# Patient Record
Sex: Female | Born: 1960 | Race: White | Hispanic: No | Marital: Married | Smoking: Former smoker
Health system: Southern US, Community
[De-identification: ages and names within clinical notes are randomized; demographics above are authoritative.]

## PROBLEM LIST (undated history)

## (undated) DIAGNOSIS — R7309 Other abnormal glucose: Secondary | ICD-10-CM

## (undated) DIAGNOSIS — Z8619 Personal history of other infectious and parasitic diseases: Secondary | ICD-10-CM

## (undated) DIAGNOSIS — I629 Nontraumatic intracranial hemorrhage, unspecified: Secondary | ICD-10-CM

## (undated) DIAGNOSIS — G4484 Primary exertional headache: Secondary | ICD-10-CM

## (undated) DIAGNOSIS — R5383 Other fatigue: Secondary | ICD-10-CM

## (undated) DIAGNOSIS — Z9889 Other specified postprocedural states: Secondary | ICD-10-CM

## (undated) DIAGNOSIS — M353 Polymyalgia rheumatica: Secondary | ICD-10-CM

## (undated) DIAGNOSIS — H269 Unspecified cataract: Secondary | ICD-10-CM

## (undated) DIAGNOSIS — K8020 Calculus of gallbladder without cholecystitis without obstruction: Secondary | ICD-10-CM

## (undated) DIAGNOSIS — K219 Gastro-esophageal reflux disease without esophagitis: Secondary | ICD-10-CM

## (undated) DIAGNOSIS — E785 Hyperlipidemia, unspecified: Secondary | ICD-10-CM

## (undated) DIAGNOSIS — J302 Other seasonal allergic rhinitis: Secondary | ICD-10-CM

## (undated) HISTORY — DX: Primary exertional headache: G44.84

## (undated) HISTORY — DX: Unspecified cataract: H26.9

## (undated) HISTORY — DX: Calculus of gallbladder without cholecystitis without obstruction: K80.20

## (undated) HISTORY — DX: Polymyalgia rheumatica: M35.3

## (undated) HISTORY — DX: Other fatigue: R53.83

## (undated) HISTORY — DX: Other seasonal allergic rhinitis: J30.2

## (undated) HISTORY — DX: Hyperlipidemia, unspecified: E78.5

## (undated) HISTORY — DX: Gastro-esophageal reflux disease without esophagitis: K21.9

## (undated) HISTORY — DX: Nontraumatic intracranial hemorrhage, unspecified: I62.9

## (undated) HISTORY — DX: Other specified postprocedural states: Z98.890

## (undated) HISTORY — DX: Personal history of other infectious and parasitic diseases: Z86.19

## (undated) HISTORY — DX: Other abnormal glucose: R73.09

---

## 1993-01-02 HISTORY — PX: ANTERIOR CRUCIATE LIGAMENT REPAIR: SHX115

## 1998-11-09 ENCOUNTER — Encounter: Admission: RE | Admit: 1998-11-09 | Discharge: 1998-11-09 | Payer: Self-pay | Admitting: Obstetrics and Gynecology

## 1998-11-09 ENCOUNTER — Encounter: Payer: Self-pay | Admitting: Obstetrics and Gynecology

## 2002-09-01 ENCOUNTER — Encounter: Payer: Self-pay | Admitting: Obstetrics and Gynecology

## 2002-09-01 ENCOUNTER — Encounter: Admission: RE | Admit: 2002-09-01 | Discharge: 2002-09-01 | Payer: Self-pay | Admitting: Obstetrics and Gynecology

## 2003-09-03 ENCOUNTER — Encounter: Admission: RE | Admit: 2003-09-03 | Discharge: 2003-09-03 | Payer: Self-pay | Admitting: Obstetrics and Gynecology

## 2003-10-26 ENCOUNTER — Emergency Department (HOSPITAL_COMMUNITY): Admission: EM | Admit: 2003-10-26 | Discharge: 2003-10-26 | Payer: Self-pay | Admitting: Family Medicine

## 2006-02-21 ENCOUNTER — Encounter: Admission: RE | Admit: 2006-02-21 | Discharge: 2006-02-21 | Payer: Self-pay | Admitting: Obstetrics and Gynecology

## 2006-02-21 ENCOUNTER — Other Ambulatory Visit: Admission: RE | Admit: 2006-02-21 | Discharge: 2006-02-21 | Payer: Self-pay | Admitting: Obstetrics and Gynecology

## 2006-03-02 ENCOUNTER — Encounter: Admission: RE | Admit: 2006-03-02 | Discharge: 2006-03-02 | Payer: Self-pay | Admitting: Obstetrics and Gynecology

## 2007-05-29 ENCOUNTER — Other Ambulatory Visit: Admission: RE | Admit: 2007-05-29 | Discharge: 2007-05-29 | Payer: Self-pay | Admitting: Obstetrics and Gynecology

## 2007-12-06 ENCOUNTER — Encounter: Admission: RE | Admit: 2007-12-06 | Discharge: 2007-12-06 | Payer: Self-pay | Admitting: Gastroenterology

## 2010-09-23 ENCOUNTER — Encounter: Payer: Self-pay | Admitting: *Deleted

## 2010-09-26 ENCOUNTER — Encounter: Payer: Self-pay | Admitting: Cardiology

## 2010-09-26 ENCOUNTER — Ambulatory Visit (INDEPENDENT_AMBULATORY_CARE_PROVIDER_SITE_OTHER): Payer: BC Managed Care – PPO | Admitting: Cardiology

## 2010-09-26 VITALS — BP 122/82 | HR 80 | Ht 63.0 in | Wt 192.0 lb

## 2010-09-26 DIAGNOSIS — E782 Mixed hyperlipidemia: Secondary | ICD-10-CM

## 2010-09-26 NOTE — Patient Instructions (Signed)
Your physician encouraged you to lose weight for better health. 2 pounds per week for a total of 40 pounds  Your physician recommends that you schedule a follow-up appointment in:  As needed with Dr. Daleen Squibb

## 2010-09-26 NOTE — Progress Notes (Signed)
HPI Jeanne Porter is a 50 year old white female who is referred by Dr. Cherly Hensen for evaluation and management of mixed hyperlipidemia.  Her last cholesterol was 295, triglycerides 29, LDL 190, VLDL 42, HDL 63.  There's a long family history of hyperlipidemia. Her as been compounded by the fact she has a gained about 40 pounds in the last several years. She blames most this on menopause.    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 She feels she's disciplined enough to lose 40 pounds. His does not want to take medication. He  TSH was normal.  She denies any symptoms of angina or ischemia. Her only other cardiac risk factor is remote smoking which she stopped years ago.     Past Medical History  Diagnosis Date  . Fatigue   . Elevated glucose     Past Surgical History  Procedure Date  . Anterior cruciate ligament repair     right knee    Family History  Problem Relation Age of Onset  . Diabetes    . Heart disease    . Heart failure    . Heart attack      History   Social History  . Marital Status: Single    Spouse Name: N/A    Number of Children: N/A  . Years of Education: N/A   Occupational History  . Not on file.   Social History Main Topics  . Smoking status: Former Games developer  . Smokeless tobacco: Not on file   Comment: quit 1994  . Alcohol Use: Yes  . Drug Use: No  . Sexually Active: Not on file   Other Topics Concern  . Not on file   Social History Narrative  . No narrative on file    No Known Allergies  Current Outpatient Prescriptions  Medication Sig Dispense Refill  . fluticasone (FLONASE) 50 MCG/ACT nasal spray Take 1 tablet by mouth as needed.        ROS Negative other than HPI.   PE General Appearance: well developed, well nourished in no acute distress, overweight HEENT: symmetrical face, PERRLA, good dentition  Neck: no JVD, thyromegaly, or adenopathy, trachea midline Chest: symmetric without deformity Cardiac: PMI  non-displaced, RRR, normal S1, S2, no gallop or murmur Lung: clear to ausculation and percussion Vascular: all pulses full without bruits  Abdominal: nondistended, nontender, good bowel sounds, no HSM, no bruits Extremities: no cyanosis, clubbing or edema, no sign of DVT, no varicosities  Skin: normal color, no rashes Neuro: alert and oriented x 3, non-focal Pysch: normal affect Filed Vitals:   09/26/10 1504  BP: 122/82  Pulse: 80  Height: 5\' 3"  (1.6 m)  Weight: 192 lb (87.091 kg)    EKG  Labs and Studies Reviewed.   No results found for this basename: WBC, HGB, HCT, MCV, PLT      Chemistry   No results found for this basename: NA, K, CL, CO2, BUN, CREATININE, GLU   No results found for this basename: CALCIUM, ALKPHOS, AST, ALT, BILITOT       No results found for this basename: CHOL   No results found for this basename: HDL   No results found for this basename: LDLCALC   No results found for this basename: TRIG   No results found for this basename: CHOLHDL   No results found for this basename: HGBA1C   No results found for this basename: ALT, AST, GGT, ALKPHOS, BILITOT   No results found for this basename: TSH

## 2010-10-06 ENCOUNTER — Encounter: Payer: Self-pay | Admitting: Cardiology

## 2012-01-25 ENCOUNTER — Encounter (HOSPITAL_COMMUNITY): Payer: Self-pay | Admitting: *Deleted

## 2012-01-25 ENCOUNTER — Emergency Department (HOSPITAL_COMMUNITY)
Admission: EM | Admit: 2012-01-25 | Discharge: 2012-01-26 | Disposition: A | Discharge status: Home / Self Care | Payer: BC Managed Care – PPO | Attending: Emergency Medicine | Admitting: Emergency Medicine

## 2012-01-25 ENCOUNTER — Encounter (HOSPITAL_COMMUNITY): Payer: Self-pay | Admitting: Emergency Medicine

## 2012-01-25 ENCOUNTER — Emergency Department (HOSPITAL_COMMUNITY)
Admission: EM | Admit: 2012-01-25 | Discharge: 2012-01-25 | Disposition: A | Discharge status: Nursing Home (Includes ICF) | Payer: BC Managed Care – PPO | Source: Home / Self Care | Attending: Family Medicine | Admitting: Family Medicine

## 2012-01-25 DIAGNOSIS — G4484 Primary exertional headache: Secondary | ICD-10-CM

## 2012-01-25 DIAGNOSIS — M542 Cervicalgia: Secondary | ICD-10-CM | POA: Insufficient documentation

## 2012-01-25 DIAGNOSIS — R51 Headache: Secondary | ICD-10-CM | POA: Insufficient documentation

## 2012-01-25 DIAGNOSIS — R110 Nausea: Secondary | ICD-10-CM | POA: Insufficient documentation

## 2012-01-25 DIAGNOSIS — Z87891 Personal history of nicotine dependence: Secondary | ICD-10-CM | POA: Insufficient documentation

## 2012-01-25 DIAGNOSIS — R42 Dizziness and giddiness: Secondary | ICD-10-CM | POA: Insufficient documentation

## 2012-01-25 DIAGNOSIS — IMO0002 Reserved for concepts with insufficient information to code with codable children: Secondary | ICD-10-CM | POA: Insufficient documentation

## 2012-01-25 NOTE — ED Notes (Signed)
Pt c/o severe headache on Monday that came on suddenly while lifting weights. Pt states that since then she has been feeling nausea and dizzy in spells off and on. Pt states that there is still a heaviness in her head and neck.   Pt had a CT scan done on Tuesday and has results with her. Pt has taken tylenol for pain with only mild relief.

## 2012-01-25 NOTE — ED Provider Notes (Signed)
History     CSN: [id number]  Arrival date & time 01/25/12  1705   First MD Initiated Contact with Patient 01/25/12 2002      Chief Complaint  Patient presents with  . Headache    (Consider location/radiation/quality/duration/timing/severity/associated sxs/prior treatment) Patient is a 52 y.o. female presenting with headaches.  Headache  Associated symptoms include nausea.   Patient presents to the emergency department sent here from the urgent care for further workup. 4 days ago patient was lifting very heavy weights when she had immediate occipital headache with nausea and mild weakness. She went to [facility] and had a CT scan done which was normal. However she continued to have some tightness in her neck which is getting much better and dizziness, nausea. So she went to [facility] today and after talking with the physician it was decided that she needed to come to the emergency department for further evaluation. The patient says that she is only having mild with of nausea, no more headache, no more dizziness, and some mild neck pain. The patient looks well is alert and oriented. She is in no acute distress.  Past Medical History  Diagnosis Date  . Fatigue   . Elevated glucose     Past Surgical History  Procedure Date  . Anterior cruciate ligament repair     right knee    Family History  Problem Relation Age of Onset  . Diabetes    . Heart disease    . Heart failure    . Heart attack      History  Substance Use Topics  . Smoking status: Former Games developer  . Smokeless tobacco: Not on file     Comment: quit 1994  . Alcohol Use: Yes    OB History    Grav Para Term Preterm Abortions TAB SAB Ect Mult Living                  Review of Systems  HENT: Positive for neck pain.   Gastrointestinal: Positive for nausea.  Neurological: Positive for headaches.    Allergies  Review of patient's allergies indicates no known allergies.  Home Medications    Current Outpatient Rx  Name  Route  Sig  Dispense  Refill  . FLUTICASONE PROPIONATE 50 MCG/ACT NA SUSP   Oral   Take 1 tablet by mouth as needed.         Marland Kitchen PRESCRIPTION MEDICATION   Transdermal   Place 1 application onto the skin daily. Estrogen replacement cream.           BP 127/54  Pulse 71  Temp 98.5 F (36.9 C) (Oral)  Resp 18  SpO2 100%  Physical Exam  Constitutional: She is oriented to person, place, and time. She appears well-developed and well-nourished.  HENT:  Head: Normocephalic and atraumatic.  Eyes: Conjunctivae normal are normal. Pupils are equal, round, and reactive to light.  Neck: Trachea normal, normal range of motion and full passive range of motion without pain. Neck supple.  Cardiovascular: Normal rate, regular rhythm and normal pulses.   Pulmonary/Chest: Effort normal and breath sounds normal. Chest wall is not dull to percussion. She exhibits no crepitus, no edema, no deformity and no retraction.  Abdominal: Soft. Normal appearance.  Musculoskeletal: Normal range of motion.  Neurological: She is alert and oriented to person, place, and time. She has normal strength.  Skin: Skin is warm, dry and intact.  Psychiatric: Her speech is normal and behavior is  normal. Thought content normal. Cognition and memory are normal.    ED Course  Procedures (including critical care time)   Labs Reviewed  CSF CELL COUNT WITH DIFFERENTIAL  CSF CELL COUNT WITH DIFFERENTIAL  PROTEIN AND GLUCOSE, CSF  CSF CULTURE   No results found. Prelim:  No evidence of aneurysm.  No evidence of dissection; the vertebral arteries appear intact, with a dominant left vertebral artery.  Mild atherosclerotic calcification noted at the proximal left vertebral artery, without evidence of luminal narrowing.  The vertebrobasilar system is unremarkable in appearance.  The ICA, MCA and ACA appear intact bilaterally.  The PCA are intact bilaterally.  No evidence of  occlusion.  Mild interstitial prominence noted at the lung apices.  Findings were discussed with Dr. Sunnie Nielsen at 02:45 a.m. on 01/26/2012.  No diagnosis found. Dx: exertional headache   MDM  Patient sent to the emergency department by urgent care. I discussed case with Dr. Manus Gunning for advisement on workup for this patient. He recommends that patient needs a lumbar puncture. The resident's well to this procedure.   Dr. Manus Gunning did LP and RBCs noted. CT  angio head/neck ordered to r/o aneurysm.   These were normal. I discussed case with Dr. Dierdre Highman who asked me to consult Dr. Venetia Maxon- Neurosurgery. Dr. Venetia Maxon says that since the patient is now asymptomatic aside from the nausea she is okay to go home. If symptoms persist she is to follow-up with Neurology as an outpatient.  Pt has been advised of the symptoms that warrant their return to the ED. Patient has voiced understanding and has agreed to follow-up with the PCP or specialist.     Dorthula Matas, PA 01/26/12 0356  Dorthula Matas, PA 01/26/12 [phone]

## 2012-01-25 NOTE — ED Notes (Signed)
The pt has had a headache and neck pain since Monday when she was lifting weights.  She also had nausea and weakness at the same time.  She was seen at an urgent care then and a day later she had a c-t scan which she has the results with her.  She still has a headache and intermittent weakness and nausea .

## 2012-01-25 NOTE — ED Provider Notes (Signed)
Lumbar Puncture Procedure Note  Pre-operative Diagnosis: Headache, r/o SAH  Post-operative Diagnosis: Headache  Indications: Diagnostic  Procedure Details   Consent: Informed consent was obtained. Risks of the procedure were discussed including: infection, bleeding, pain and headache.  The patient was positioned under sterile conditions. Betadine solution and sterile drapes were utilized. A spinal needle was inserted at the L4 - L5 interspace.  Spinal fluid was obtained and sent to the laboratory.  Findings 4mL of clear spinal fluid was obtained.   Complications:  None; patient tolerated the procedure well.        Condition: stable  Plan Bed rest for 1 hours. F/u LP results     Stevie Kern, MD 01/25/12 2322

## 2012-01-25 NOTE — ED Provider Notes (Signed)
History     CSN: [id number]  Arrival date & time 01/25/12  1539   First MD Initiated Contact with Patient 01/25/12 1557      Chief Complaint  Patient presents with  . Headache    severe headache on monday. now feeling nausea and dizziness in waves over the past several days.     (Consider location/radiation/quality/duration/timing/severity/associated sxs/prior treatment) Patient is a 52 y.o. female presenting with headaches. The history is provided by the patient.  Headache The primary symptoms include headaches, dizziness and nausea. The symptoms began 3 to 5 days ago (onset within minutes of completeing heavy bicep workout routinf at gym which got worse and has persistedto involve entire head since). The symptoms are waxing and waning. The symptoms occurred on exertion and while straining.  Dizziness also occurs with nausea.  Associated symptoms comments: Dizziness , nausea, increasing pain.. Workup history includes CT scan.    Past Medical History  Diagnosis Date  . Fatigue   . Elevated glucose     Past Surgical History  Procedure Date  . Anterior cruciate ligament repair     right knee    Family History  Problem Relation Age of Onset  . Diabetes    . Heart disease    . Heart failure    . Heart attack      History  Substance Use Topics  . Smoking status: Former Games developer  . Smokeless tobacco: Not on file     Comment: quit 1994  . Alcohol Use: Yes    OB History    Grav Para Term Preterm Abortions TAB SAB Ect Mult Living                  Review of Systems  Constitutional: Negative.   Gastrointestinal: Positive for nausea.  Neurological: Positive for dizziness and headaches.    Allergies  Review of patient's allergies indicates no known allergies.  Home Medications   Current Outpatient Rx  Name  Route  Sig  Dispense  Refill  . FLUTICASONE PROPIONATE 50 MCG/ACT NA SUSP   Oral   Take 1 tablet by mouth as needed.           BP 129/74  Pulse 63   Temp 98.2 F (36.8 C) (Oral)  Resp 16  SpO2 100%  Physical Exam  Nursing note and vitals reviewed. Constitutional: She is oriented to person, place, and time. She appears well-developed and well-nourished. No distress.  HENT:  Head: Normocephalic.  Right Ear: External ear normal.  Left Ear: External ear normal.  Mouth/Throat: Oropharynx is clear and moist.  Eyes: Conjunctivae normal and EOM are normal. Pupils are equal, round, and reactive to light.  Neck: Normal range of motion. Neck supple.  Cardiovascular: Regular rhythm.   Abdominal: Soft. Bowel sounds are normal. There is no tenderness.  Musculoskeletal: Normal range of motion.  Lymphadenopathy:    She has no cervical adenopathy.  Neurological: She is alert and oriented to person, place, and time. She displays normal reflexes. No cranial nerve deficit. She exhibits normal muscle tone. Coordination normal.  Skin: Skin is warm and dry.  Psychiatric: She has a normal mood and affect. Her behavior is normal. Judgment and thought content normal.    ED Course  Procedures (including critical care time)  Labs Reviewed - No data to display No results found.   1. Exertional headache       MDM          Linna Hoff, MD 01/25/12  1652 

## 2012-01-25 NOTE — ED Provider Notes (Addendum)
This chart was scribed for Glynn Octave, MD by Bennett Scrape, ED Scribe. This patient was seen in room CD10C/CD10C and the patient's care was started at 8:45 PM.  Jeanne Porter is a 52 y.o. female who presents to the Emergency Department complaining of sudden onset HA that radiated into the neck with associated nausea and dizziness while lifting weights. She denies having a HA currently but still c/o intermittent nausea and dizziness. Pt states that the HA started suddenly in the right frontal region and spread across to the back 3 to 4 minutes after she had just finished lifting weights. She denies having prior episodes of similar symptoms and denies having a h/o migraines. She reports that she was seen at an Urgent Care and had a negative CT scan performed 24 hours after the original onset. She reports that she is here for evaluation of persistent nausea and dizziness.  PE CONSTITUTIONAL: No distress, no meningismus. EYES: PERRL, EOM intact, Visual fields full to confrontation  CV: Intact distal pulses. 5/5 strength throughout, no ataxia on finger to nose.    8:53 PM-Discussed treatment plan which includes LP with pt at bedside and pt states that she would consider it. Advised pt that she would be leaving AMA if she decided to not get the LP.   Sudden onset headache 3 days ago during exercise which has resolved. Intermittent nausea and dizziness since.  Nonfocal neuro exam.  LP performed by resident Roxy Cedar without difficulty.  Fluid appears clear however RBCS noted 1010 in tube 1, 1060 in tube 4. No xanthochromia.  D/w Dr. Venetia Maxon of neurosurgery. Would expect xanthochromia at this point. He recommends CTA head and neck. Pending at time of sign out to Dr. Dierdre Highman and [facility] Neva Seat.  I personally performed the services described in this documentation, which was scribed in my presence. The recorded information has been reviewed and is accurate.    Glynn Octave, MD 01/26/12 6578  Glynn Octave, MD 01/26/12 1116

## 2012-01-26 ENCOUNTER — Emergency Department (HOSPITAL_COMMUNITY): Payer: BC Managed Care – PPO

## 2012-01-26 ENCOUNTER — Encounter (HOSPITAL_COMMUNITY): Payer: Self-pay | Admitting: Radiology

## 2012-01-26 LAB — POCT I-STAT, CHEM 8
BUN: 9 mg/dL (ref 6–23)
Calcium, Ion: 1.18 mmol/L (ref 1.12–1.23)
Creatinine, Ser: 0.9 mg/dL (ref 0.50–1.10)
Glucose, Bld: 101 mg/dL — ABNORMAL HIGH (ref 70–99)
Hemoglobin: 14.6 g/dL (ref 12.0–15.0)
TCO2: 25 mmol/L (ref 0–100)

## 2012-01-26 LAB — CSF CELL COUNT WITH DIFFERENTIAL
RBC Count, CSF: 1060 /mm3 — ABNORMAL HIGH
Tube #: 1
Tube #: 4

## 2012-01-26 LAB — GRAM STAIN

## 2012-01-26 LAB — PROTEIN AND GLUCOSE, CSF: Glucose, CSF: 60 mg/dL (ref 43–76)

## 2012-01-26 MED ORDER — IOHEXOL 350 MG/ML SOLN
50.0000 mL | Freq: Once | INTRAVENOUS | Status: AC | PRN
  Administered 2012-01-26: 50 mL via INTRAVENOUS

## 2012-01-26 NOTE — ED Provider Notes (Signed)
Medical screening examination/treatment/procedure(s) were conducted as a shared visit with non-physician practitioner(s) and myself.  I personally evaluated the patient during the encounter  See my additional notes  Glynn Octave, MD 01/26/12 1121

## 2012-01-26 NOTE — ED Notes (Signed)
returned from CT 

## 2012-01-26 NOTE — ED Provider Notes (Signed)
I saw and evaluated the patient, reviewed the resident's note and I agree with the findings and plan.  I was immediately available for procedure.  Glynn Octave, MD 01/26/12 [phone]

## 2012-01-26 NOTE — ED Notes (Addendum)
Patient transported to CT 

## 2012-01-29 LAB — CSF CULTURE W GRAM STAIN: Culture: NO GROWTH

## 2012-03-25 ENCOUNTER — Ambulatory Visit (INDEPENDENT_AMBULATORY_CARE_PROVIDER_SITE_OTHER): Payer: BC Managed Care – PPO | Admitting: Internal Medicine

## 2012-03-25 ENCOUNTER — Encounter: Payer: Self-pay | Admitting: Internal Medicine

## 2012-03-25 VITALS — BP 130/90 | HR 73 | Temp 98.7°F | Ht 62.75 in | Wt 182.0 lb

## 2012-03-25 DIAGNOSIS — J309 Allergic rhinitis, unspecified: Secondary | ICD-10-CM

## 2012-03-25 DIAGNOSIS — K219 Gastro-esophageal reflux disease without esophagitis: Secondary | ICD-10-CM

## 2012-03-25 DIAGNOSIS — Z79890 Hormone replacement therapy (postmenopausal): Secondary | ICD-10-CM

## 2012-03-25 DIAGNOSIS — J302 Other seasonal allergic rhinitis: Secondary | ICD-10-CM

## 2012-03-25 DIAGNOSIS — K8020 Calculus of gallbladder without cholecystitis without obstruction: Secondary | ICD-10-CM

## 2012-03-25 DIAGNOSIS — E782 Mixed hyperlipidemia: Secondary | ICD-10-CM

## 2012-03-25 NOTE — Progress Notes (Signed)
Chief Complaint  Patient presents with  . Establish Care    HPI: Patient comes in as new patient visit . Previous care was  [facility]    Needs pcp  Has been seen urgent care and ed but doing ok now.    Ed  X 2 visit for exertional HA   Evaluated with mri and lp some rbcs but no sx and fu op this ws oin January .  No fu unless   Had sx     Never had again. Thinks she reallky pushed to hard with lifting?    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   Gyne :Cousins  Also    HB:/? HH hx gets   Stomach problem  Has seen Dr Loreta Ave   And had Gallstones [place] : had attacks  But went to a   wholistic doctor   And " Flushed them out.  " no w feels better but ? If risk of not taking out gb such as cancer etc .    Post menopasual weight gain  Needs to work on this  And knows what she should do l.  Avoids meds   LIPIDS:  Worse after menopause. Saw dr wall in 2012 for lipid evaluation   Found via gyne dr Cherly Hensen.   ROS: See pertinent positives and negatives per HPI.  fam hx Mgm  60 s  Heart disease heart    High cholesterol .Marland Kitchen  Mom   Lipids   Down to 225 after ;lsi  Brother 300  At some point   ? On meds  NO premature CV disease Past Medical History  Diagnosis Date  . Fatigue   . Elevated glucose   . Hx of varicella   . GERD (gastroesophageal reflux disease)   . Seasonal allergies   . Hyperlipidemia   . Hx of right knee surgery     acl  . Gallstones     reported "cleansing " and passes many stones and no more sx currrently   . Exertional headache     had ed eval and had small  bleed ;felt to be able to follow  and recheck as needed per neuro     Family History  Problem Relation Age of Onset  . Diabetes    . Heart disease    . Heart failure    . Heart attack    . Hyperlipidemia Mother   . Alcohol abuse Father   . Hyperlipidemia Maternal Grandmother   . Heart disease Maternal Grandmother   . Diabetes Maternal Grandfather   . Heart disease Paternal  Grandmother     History   Social History  . Marital Status: Single    Spouse Name: N/A    Number of Children: N/A  . Years of Education: N/A   Social History Main Topics  . Smoking status: Former Games developer  . Smokeless tobacco: None     Comment: quit 1994  . Alcohol Use: Yes  . Drug Use: No  . Sexually Active: No   Other Topics Concern  . 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    Outpatient Encounter Prescriptions as of 03/25/2012  Medication Sig Dispense Refill  . fluticasone (FLONASE) 50 MCG/ACT nasal spray Take 1 tablet by mouth as needed.      Marland Kitchen PRESCRIPTION MEDICATION Place 1 application onto the skin daily. Estrogen replacement cream.       No facility-administered encounter medications on file as of 03/25/2012.    EXAM:  BP 130/90  Pulse 73  Temp(Src) 98.7 F (37.1 C) (Oral)  Ht 5' 2.75" (1.594 m)  Wt 182 lb (82.555 kg)  BMI 32.49 kg/m2  SpO2 97%  Body mass index is 32.49 kg/(m^2).  GENERAL: vitals reviewed and listed above, alert, oriented, appears well hydrated and in no acute distress  HEENT: atraumatic, conjunctiva  clear, no obvious abnormalities on inspection of external nose and ears OP : no lesion edema or exudate   NECK: no obvious masses on inspection palpation  No adenopathy or bruits  LUNGS: clear to auscultation bilaterally, no wheezes, rales or rhonchi, good air movement  CV: HRRR, no g or m  no clubbing cyanosis or  peripheral edema nl cap refill  Abdomen:  Sof,t normal bowel sounds without hepatosplenomegaly, no guarding rebound or masses no CVA tenderness MS: moves all extremities without noticeable focal  abnormality PSYCH: pleasant and cooperative, no  obvious depression or anxiety NEURO: oriented x 3 CN 3-12 appear intact. No focal muscle weakness or atrophy.. Gait WNL.  Grossly non focal. No tremor or abnormal movement. Oriented x 3 and no noted deficits in memory, attention, and speech.  ASSESSMENT AND PLAN:  Discussed the following assessment and plan:  Mixed hyperlipidemia - hx cvd but no premature  lsi still advised  will follow   Gallstones - says she passed them  she could still have gb dysfunction counseled  had gi evaluation  Seasonal allergies - otc meds except flonase  GERD (gastroesophageal reflux disease)  Postmenopausal HRT (hormone replacement therapy) - topical per GYNE Counseled regarding healthy nutrition, exercise, sleep, injury prevention, calcium vit d and healthy weight . -Patient advised to return or notify health care team  if symptoms worsen or persist or new concerns arise.  Patient Instructions  Intensify lifestyle interventions.  To help with lipids and  Decrease risk of vascular disease.   Weight loss  In a heathy manner   will help your health.      Plan labs in fall and  The OV to discuss.      Neta Mends. Sudais Banghart M.D.  Health Maintenance  Topic Date Due  . Influenza Vaccine  09/02/1961  . Colonoscopy  04/04/2011  . Tetanus/tdap  01/04/2013  . Mammogram  02/04/2014  . Pap Smear  02/05/2015   Assumed she had a colonoscopy  But not will call when wants referral  Or see Dr Loreta Ave }

## 2012-03-25 NOTE — Patient Instructions (Addendum)
Intensify lifestyle interventions.  To help with lipids and  Decrease risk of vascular disease.   Weight loss  In a heathy manner   will help your health.      Plan labs in fall and  The OV to discuss.

## 2012-03-31 ENCOUNTER — Encounter: Payer: Self-pay | Admitting: Internal Medicine

## 2012-03-31 DIAGNOSIS — J302 Other seasonal allergic rhinitis: Secondary | ICD-10-CM | POA: Insufficient documentation

## 2012-03-31 DIAGNOSIS — K8020 Calculus of gallbladder without cholecystitis without obstruction: Secondary | ICD-10-CM | POA: Insufficient documentation

## 2012-03-31 DIAGNOSIS — K219 Gastro-esophageal reflux disease without esophagitis: Secondary | ICD-10-CM | POA: Insufficient documentation

## 2012-03-31 DIAGNOSIS — Z79890 Hormone replacement therapy (postmenopausal): Secondary | ICD-10-CM | POA: Insufficient documentation

## 2012-10-04 ENCOUNTER — Other Ambulatory Visit: Payer: BC Managed Care – PPO

## 2012-10-11 ENCOUNTER — Ambulatory Visit: Payer: BC Managed Care – PPO | Admitting: Internal Medicine

## 2012-11-07 ENCOUNTER — Other Ambulatory Visit: Payer: Self-pay

## 2013-01-02 HISTORY — PX: EYE SURGERY: SHX253

## 2013-02-11 ENCOUNTER — Other Ambulatory Visit: Payer: Self-pay | Admitting: Gastroenterology

## 2013-02-11 DIAGNOSIS — R1013 Epigastric pain: Secondary | ICD-10-CM

## 2013-02-17 ENCOUNTER — Other Ambulatory Visit: Payer: BC Managed Care – PPO

## 2013-02-19 ENCOUNTER — Inpatient Hospital Stay: Admission: RE | Admit: 2013-02-19 | Payer: BC Managed Care – PPO | Source: Ambulatory Visit

## 2013-02-26 ENCOUNTER — Ambulatory Visit
Admission: RE | Admit: 2013-02-26 | Discharge: 2013-02-26 | Disposition: A | Discharge status: Home / Self Care | Payer: BC Managed Care – PPO | Source: Ambulatory Visit | Attending: Gastroenterology | Admitting: Gastroenterology

## 2013-02-26 DIAGNOSIS — R1013 Epigastric pain: Secondary | ICD-10-CM

## 2013-10-01 ENCOUNTER — Ambulatory Visit: Payer: BC Managed Care – PPO | Admitting: Internal Medicine

## 2013-10-02 ENCOUNTER — Ambulatory Visit: Payer: BC Managed Care – PPO | Admitting: Internal Medicine

## 2014-05-27 ENCOUNTER — Encounter (HOSPITAL_COMMUNITY): Payer: Self-pay | Admitting: Emergency Medicine

## 2014-05-27 ENCOUNTER — Emergency Department (HOSPITAL_COMMUNITY)
Admission: EM | Admit: 2014-05-27 | Discharge: 2014-05-27 | Disposition: A | Discharge status: Home / Self Care | Payer: BLUE CROSS/BLUE SHIELD | Source: Home / Self Care | Attending: Family Medicine | Admitting: Family Medicine

## 2014-05-27 DIAGNOSIS — Z23 Encounter for immunization: Secondary | ICD-10-CM | POA: Diagnosis not present

## 2014-05-27 DIAGNOSIS — T148 Other injury of unspecified body region: Secondary | ICD-10-CM | POA: Diagnosis not present

## 2014-05-27 DIAGNOSIS — IMO0002 Reserved for concepts with insufficient information to code with codable children: Secondary | ICD-10-CM

## 2014-05-27 MED ORDER — TETANUS-DIPHTH-ACELL PERTUSSIS 5-2.5-18.5 LF-MCG/0.5 IM SUSP
0.5000 mL | Freq: Once | INTRAMUSCULAR | Status: AC
  Administered 2014-05-27: 0.5 mL via INTRAMUSCULAR

## 2014-05-27 MED ORDER — TETANUS-DIPHTH-ACELL PERTUSSIS 5-2.5-18.5 LF-MCG/0.5 IM SUSP
INTRAMUSCULAR | Status: AC
  Filled 2014-05-27: qty 0.5

## 2014-05-27 MED ORDER — CEPHALEXIN 500 MG PO CAPS: 500.0000 mg | ORAL_CAPSULE | Freq: Two times a day (BID) | ORAL | Status: DC

## 2014-05-27 NOTE — ED Provider Notes (Signed)
Oliver HumMelanie G Deacon is a 54 y.o. female who presents to Urgent Care today for Laceration to the right fifth digit of the hand. Patient was using a razor blade to scrape of labile off of a glass and slipped. The razor blade cut her dorsal PIP on the fifth digit of the right hand. She denies any radiating pain weakness or numbness or difficulty with motion. She washed the wound out. Her last tetanus vaccination was over 10 years ago.   Past Medical History  Diagnosis Date  . Fatigue   . Elevated glucose   . Hx of varicella   . GERD (gastroesophageal reflux disease)   . Seasonal allergies   . Hyperlipidemia   . Hx of right knee surgery     acl  . Gallstones     reported "cleansing " and passes many stones and no more sx currrently   . Exertional headache     had ed eval and had small  bleed ;felt to be able to follow  and recheck as needed per neuro    Past Surgical History  Procedure Laterality Date  . Anterior cruciate ligament repair  1995    right knee   History  Substance Use Topics  . Smoking status: Former Games developermoker  . Smokeless tobacco: Not on file     Comment: quit 1994  . Alcohol Use: Yes   ROS as above Medications: No current facility-administered medications for this encounter.   Current Outpatient Prescriptions  Medication Sig Dispense Refill  . cephALEXin (KEFLEX) 500 MG capsule Take 1 capsule (500 mg total) by mouth 2 (two) times daily. 21 capsule 0  . fluticasone (FLONASE) 50 MCG/ACT nasal spray Take 1 tablet by mouth as needed.    Marland Kitchen. PRESCRIPTION MEDICATION Place 1 application onto the skin daily. Estrogen replacement cream.     No Known Allergies   Exam:  BP 124/85 mmHg  Pulse 71  Temp(Src) 98.8 F (37.1 C) (Oral)  Resp 18  SpO2 100% Gen: Well NAD HEENT: EOMI,  MMM Skin: Right hand fifth digit 1 cm laceration crossing the dorsal fifth PIP obliquely. Wound inspected under range of motion. No tendon visible. Extension flexion strength are intact. Sensation and  capillary refill are intact distally Exts: Brisk capillary refill, warm and well perfused.   Laceration repair. Consent obtained and timeout performed. 1 mL of lidocaine without epinephrine injected along the wound edges achieving good anesthesia. Wound copiously irrigated with sure cleanse solution. Betadine applied and the wound was prepped and draped in the usual sterile fashion. 2 horizontal mattress sutures in one simple interrupted sutures were used to close the wound using 5-0 proline. Betadine removed. Dressing applied. Patient tolerated the procedure well  No results found for this or any previous visit (from the past 24 hour(s)). No results found.  Assessment and Plan: 54 y.o. female with right fifth digit laceration. No deep structures involved. Wound repaired. Tetanus vaccination provided prior to discharge. Keflex antibody she used empirically. Return as needed.  Discussed warning signs or symptoms. Please see discharge instructions. Patient expresses understanding.     Rodolph BongEvan S Christyne Mccain, MD 05/27/14 [phone]

## 2014-05-27 NOTE — Discharge Instructions (Signed)
Thank you for coming in today. ° °Laceration Care, Adult °A laceration is a cut or lesion that goes through all layers of the skin and into the tissue just beneath the skin. °TREATMENT  °Some lacerations may not require closure. Some lacerations may not be able to be closed due to an increased risk of infection. It is important to see your caregiver as soon as possible after an injury to minimize the risk of infection and maximize the opportunity for successful closure. °If closure is appropriate, pain medicines may be given, if needed. The wound will be cleaned to help prevent infection. Your caregiver will use stitches (sutures), staples, wound glue (adhesive), or skin adhesive strips to repair the laceration. These tools bring the skin edges together to allow for faster healing and a better cosmetic outcome. However, all wounds will heal with a scar. Once the wound has healed, scarring can be minimized by covering the wound with sunscreen during the day for 1 full year. °HOME CARE INSTRUCTIONS  °For sutures or staples: °· Keep the wound clean and dry. °· If you were given a bandage (dressing), you should change it at least once a day. Also, change the dressing if it becomes wet or dirty, or as directed by your caregiver. °· Wash the wound with soap and water 2 times a day. Rinse the wound off with water to remove all soap. Pat the wound dry with a clean towel. °· After cleaning, apply a thin layer of the antibiotic ointment as recommended by your caregiver. This will help prevent infection and keep the dressing from sticking. °· You may shower as usual after the first 24 hours. Do not soak the wound in water until the sutures are removed. °· Only take over-the-counter or prescription medicines for pain, discomfort, or fever as directed by your caregiver. °· Get your sutures or staples removed as directed by your caregiver. °For skin adhesive strips: °· Keep the wound clean and dry. °· Do not get the skin adhesive  strips wet. You may bathe carefully, using caution to keep the wound dry. °· If the wound gets wet, pat it dry with a clean towel. °· Skin adhesive strips will fall off on their own. You may trim the strips as the wound heals. Do not remove skin adhesive strips that are still stuck to the wound. They will fall off in time. °For wound adhesive: °· You may briefly wet your wound in the shower or bath. Do not soak or scrub the wound. Do not swim. Avoid periods of heavy perspiration until the skin adhesive has fallen off on its own. After showering or bathing, gently pat the wound dry with a clean towel. °· Do not apply liquid medicine, cream medicine, or ointment medicine to your wound while the skin adhesive is in place. This may loosen the film before your wound is healed. °· If a dressing is placed over the wound, be careful not to apply tape directly over the skin adhesive. This may cause the adhesive to be pulled off before the wound is healed. °· Avoid prolonged exposure to sunlight or tanning lamps while the skin adhesive is in place. Exposure to ultraviolet light in the first year will darken the scar. °· The skin adhesive will usually remain in place for 5 to 10 days, then naturally fall off the skin. Do not pick at the adhesive film. °You may need a tetanus shot if: °· You cannot remember when you had your last tetanus shot. °·   You have never had a tetanus shot. °If you get a tetanus shot, your arm may swell, get red, and feel warm to the touch. This is common and not a problem. If you need a tetanus shot and you choose not to have one, there is a rare chance of getting tetanus. Sickness from tetanus can be serious. °SEEK MEDICAL CARE IF:  °· You have redness, swelling, or increasing pain in the wound. °· You see a red line that goes away from the wound. °· You have yellowish-white fluid (pus) coming from the wound. °· You have a fever. °· You notice a bad smell coming from the wound or dressing. °· Your  wound breaks open before or after sutures have been removed. °· You notice something coming out of the wound such as wood or glass. °· Your wound is on your hand or foot and you cannot move a finger or toe. °SEEK IMMEDIATE MEDICAL CARE IF:  °· Your pain is not controlled with prescribed medicine. °· You have severe swelling around the wound causing pain and numbness or a change in color in your arm, hand, leg, or foot. °· Your wound splits open and starts bleeding. °· You have worsening numbness, weakness, or loss of function of any joint around or beyond the wound. °· You develop painful lumps near the wound or on the skin anywhere on your body. °MAKE SURE YOU:  °· Understand these instructions. °· Will watch your condition. °· Will get help right away if you are not doing well or get worse. °Document Released: 12/19/2004 Document Revised: 03/13/2011 Document Reviewed: 06/14/2010 °ExitCare® Patient Information ©2015 [employer]. This information is not intended to replace advice given to you by your health care provider. Make sure you discuss any questions you have with your health care provider. ° °

## 2014-05-27 NOTE — ED Notes (Signed)
C/o right pinky finger laceration  States she was cleaning her glass with a razor blade when she slit her finger States razor was dirty Tetanus shot 2005

## 2014-06-05 ENCOUNTER — Emergency Department (INDEPENDENT_AMBULATORY_CARE_PROVIDER_SITE_OTHER)
Admission: EM | Admit: 2014-06-05 | Discharge: 2014-06-05 | Disposition: A | Discharge status: Home / Self Care | Payer: BLUE CROSS/BLUE SHIELD | Source: Home / Self Care | Attending: Family Medicine | Admitting: Family Medicine

## 2014-06-05 ENCOUNTER — Encounter (HOSPITAL_COMMUNITY): Payer: Self-pay | Admitting: Emergency Medicine

## 2014-06-05 DIAGNOSIS — Z4802 Encounter for removal of sutures: Secondary | ICD-10-CM | POA: Diagnosis not present

## 2014-06-05 NOTE — ED Notes (Signed)
Pt here to have sutures removed from her right pinky finger.  Wound was clean, dry, and intact.  Pt denies any pain or issues.

## 2014-06-05 NOTE — ED Provider Notes (Signed)
CSN: [id number]     Arrival date & time 06/05/14  0807 History   First MD Initiated Contact with Patient 06/05/14 [phone]     No chief complaint on file.  (Consider location/radiation/quality/duration/timing/severity/associated sxs/prior Treatment) Patient is a 54 y.o. female presenting with suture removal. The history is provided by the patient.  Suture / Staple Removal This is a new problem. The current episode started more than 1 week ago (cut r5th finger with razor blade on 5/25, no problems, here for sr.). The problem has been gradually improving.    Past Medical History  Diagnosis Date  . Fatigue   . Elevated glucose   . Hx of varicella   . GERD (gastroesophageal reflux disease)   . Seasonal allergies   . Hyperlipidemia   . Hx of right knee surgery     acl  . Gallstones     reported "cleansing " and passes many stones and no more sx currrently   . Exertional headache     had ed eval and had small  bleed ;felt to be able to follow  and recheck as needed per neuro    Past Surgical History  Procedure Laterality Date  . Anterior cruciate ligament repair  1995    right knee   Family History  Problem Relation Age of Onset  . Diabetes    . Heart disease    . Heart failure    . Heart attack    . Hyperlipidemia Mother   . Alcohol abuse Father   . Hyperlipidemia Maternal Grandmother   . Heart disease Maternal Grandmother   . Diabetes Maternal Grandfather   . Heart disease Paternal Grandmother    History  Substance Use Topics  . Smoking status: Former Games developermoker  . Smokeless tobacco: Not on file     Comment: quit 1994  . Alcohol Use: Yes   OB History    No data available     Review of Systems  Constitutional: Negative.   Musculoskeletal: Negative for joint swelling.  Skin: Positive for wound.    Allergies  Review of patient's allergies indicates no known allergies.  Home Medications   Prior to Admission medications   Medication Sig Start Date End Date Taking?  Authorizing Provider  cephALEXin (KEFLEX) 500 MG capsule Take 1 capsule (500 mg total) by mouth 2 (two) times daily. 05/27/14   Rodolph BongEvan S Corey, MD  fluticasone (FLONASE) 50 MCG/ACT nasal spray Take 1 tablet by mouth as needed. 08/14/10   Historical Provider, MD  PRESCRIPTION MEDICATION Place 1 application onto the skin daily. Estrogen replacement cream.    Historical Provider, MD   There were no vitals taken for this visit. Physical Exam  Constitutional: She is oriented to person, place, and time. She appears well-developed and well-nourished.  Musculoskeletal: Normal range of motion. She exhibits no tenderness.  Lac well healed, nvt intact, sutures removed.  Neurological: She is alert and oriented to person, place, and time.  Skin: Skin is warm and dry.  Nursing note and vitals reviewed.   ED Course  Procedures (including critical care time) Labs Review Labs Reviewed - No data to display  Imaging Review No results found.   MDM   1. Encounter for removal of sutures       Linna HoffJames D Alizee Maple, MD 06/05/14 [phone]

## 2016-09-22 ENCOUNTER — Encounter: Payer: Self-pay | Admitting: Internal Medicine

## 2019-11-06 DIAGNOSIS — Z23 Encounter for immunization: Secondary | ICD-10-CM | POA: Diagnosis not present

## 2020-08-23 ENCOUNTER — Encounter (INDEPENDENT_AMBULATORY_CARE_PROVIDER_SITE_OTHER): Payer: Self-pay | Admitting: Ophthalmology

## 2020-08-23 ENCOUNTER — Ambulatory Visit (INDEPENDENT_AMBULATORY_CARE_PROVIDER_SITE_OTHER): Payer: BC Managed Care – PPO | Admitting: Ophthalmology

## 2020-08-23 ENCOUNTER — Other Ambulatory Visit: Payer: Self-pay

## 2020-08-23 DIAGNOSIS — H33312 Horseshoe tear of retina without detachment, left eye: Secondary | ICD-10-CM

## 2020-08-23 DIAGNOSIS — H43813 Vitreous degeneration, bilateral: Secondary | ICD-10-CM | POA: Diagnosis not present

## 2020-08-23 DIAGNOSIS — H2513 Age-related nuclear cataract, bilateral: Secondary | ICD-10-CM

## 2020-08-23 NOTE — Assessment & Plan Note (Signed)
Moderate physiologic OU accounts for patient's near vision difficulties, typical presbyopia reviewed with the patient  Patient may see general ophthalmology or optometry for measurements if she so desires

## 2020-08-23 NOTE — Assessment & Plan Note (Signed)
History of laser retinopexy, 2015 no new breaks

## 2020-08-23 NOTE — Assessment & Plan Note (Signed)
Stable and physiologic OU

## 2020-08-23 NOTE — Progress Notes (Signed)
08/23/2020     CHIEF COMPLAINT Patient presents for  Chief Complaint  Patient presents with   Retina Evaluation      HISTORY OF PRESENT ILLNESS: Jeanne Porter is a 60 y.o. female who presents to the clinic today for:   HPI     Retina Evaluation           Laterality: both eyes   Onset: years ago         Comments   NP last seen 2017, oct ou- hx of retinal detachment Pt. States she feels like her eyes are worsening. Pt hasnt had insurance in a couple years, states she got insurance again, wanted to come back to check on her eyes because she had a retinal detachment. Pt states she has a lot of floaters ou, OD worse than OD, "feels like I have kaleidescope eyes with how many floaters I notice. I think occasionally I get flashes."        Last edited by Nelva Nay, COA on 08/23/2020  8:35 AM.      Referring physician: Madelin Headings, MD [address] [place],  [place] [address]  HISTORICAL INFORMATION:   Selected notes from the MEDICAL RECORD NUMBER       CURRENT MEDICATIONS: No current outpatient medications on file. (Ophthalmic Drugs)   No current facility-administered medications for this visit. (Ophthalmic Drugs)   Current Outpatient Medications (Other)  Medication Sig   cephALEXin (KEFLEX) 500 MG capsule Take 1 capsule (500 mg total) by mouth 2 (two) times daily.   fluticasone (FLONASE) 50 MCG/ACT nasal spray Take 1 tablet by mouth as needed.   PRESCRIPTION MEDICATION Place 1 application onto the skin daily. Estrogen replacement cream.   No current facility-administered medications for this visit. (Other)      REVIEW OF SYSTEMS:    ALLERGIES No Known Allergies  PAST MEDICAL HISTORY Past Medical History:  Diagnosis Date   Elevated glucose    Exertional headache    had ed eval and had small  bleed ;felt to be able to follow  and recheck as needed per neuro    Fatigue    Gallstones    reported "cleansing " and passes many stones  and no more sx currrently    GERD (gastroesophageal reflux disease)    Hx of right knee surgery    acl   Hx of varicella    Hyperlipidemia    Seasonal allergies    Past Surgical History:  Procedure Laterality Date   ANTERIOR CRUCIATE LIGAMENT REPAIR  1995   right knee    FAMILY HISTORY Family History  Problem Relation Age of Onset   Diabetes Other    Heart disease Other    Heart failure Other    Heart attack Other    Hyperlipidemia Mother    Alcohol abuse Father    Hyperlipidemia Maternal Grandmother    Heart disease Maternal Grandmother    Diabetes Maternal Grandfather    Heart disease Paternal Grandmother     SOCIAL HISTORY Social History   Tobacco Use   Smoking status: Former    Types: Cigarettes    Passive exposure: Past   Smokeless tobacco: Never   Tobacco comments:    quit 1994  Substance Use Topics   Alcohol use: Yes   Drug use: No         OPHTHALMIC EXAM:  Base Eye Exam     Visual Acuity (Snellen - Linear)  Right Left   Dist [place] 20/20 20/20   [place] J3 J5         Tonometry (Tonopen, 8:29 AM)       Right Left   Pressure 15 18         Pupils       Pupils Dark Light Shape React APD   Right PERRL 4 3 Round Brisk None   Left PERRL 4 3 Round Brisk None         Visual Fields (Counting fingers)       Left Right    Full Full         Extraocular Movement       Right Left    Full Full         Neuro/Psych     Oriented x3: Yes   Mood/Affect: Normal         Dilation     Both eyes: 1.0% Mydriacyl, 2.5% Phenylephrine @ 8:29 AM           Slit Lamp and Fundus Exam     External Exam       Right Left   External Normal Normal         Slit Lamp Exam       Right Left   Lids/Lashes Normal Normal   Conjunctiva/Sclera White and quiet White and quiet   Cornea Clear Clear   Anterior Chamber Deep and quiet Deep and quiet   Iris Round and reactive Round and reactive   Lens Clear Clear   Anterior Vitreous  Normal Normal         Fundus Exam       Right Left   Posterior Vitreous Normal Normal   Disc Normal Normal   C/D Ratio 0.35 0.35   Macula Normal Normal   Vessels Normal Normal   Periphery Normal Laser scars, Horseshoe tear  200 meridian            IMAGING AND PROCEDURES  Imaging and Procedures for 08/23/20  OCT, Retina - OU - Both Eyes       Right Eye Quality was good. Scan locations included subfoveal. Central Foveal Thickness: 284. Progression has been stable. Findings include normal foveal contour, normal observations.   Left Eye Quality was good. Central Foveal Thickness: 273. Progression has been stable. Findings include normal observations, normal foveal contour.   Notes Posterior vitreous detachment appears present OU     Color Fundus Photography Optos - OU - Both Eyes       Good laser retinopexy at 132 meridian OS, clear media no new breaks, PVD present  OD Media clear periphery normal             ASSESSMENT/PLAN:  Operculated retinal tear, left History of laser retinopexy, 2015 no new breaks  Posterior vitreous detachment of both eyes Stable and physiologic OU  Nuclear sclerotic cataract of both eyes Moderate physiologic OU accounts for patient's near vision difficulties, typical presbyopia reviewed with the patient  Patient may see general ophthalmology or optometry for measurements if she so desires     ICD-10-CM   1. Operculated retinal tear, left  H33.312 Color Fundus Photography Optos - OU - Both Eyes    2. Posterior vitreous detachment of both eyes  H43.813 OCT, Retina - OU - Both Eyes    3. Nuclear sclerotic cataract of both eyes  H25.13       1.  OU with presbyopia symptoms patient needs optometry under general ophthalmology  evaluation  2.  I have recommended because of location, [facility] on [address] Doctor Velna Ochs, Dr. Philis Kendall or Dr. Roselyn Meier  3.  Ophthalmic Meds Ordered this visit:   No orders of the defined types were placed in this encounter.      Return in about 2 years (around 08/24/2022) for DILATE OU, COLOR FP.  There are no Patient Instructions on file for this visit.   Explained the diagnoses, plan, and follow up with the patient and they expressed understanding.  Patient expressed understanding of the importance of proper follow up care.   Alford Highland Kianna Billet M.D. Diseases & Surgery of the Retina and Vitreous [facility] 08/23/20     Abbreviations: M myopia (nearsighted); A astigmatism; H hyperopia (farsighted); P presbyopia; Mrx spectacle prescription;  CTL contact lenses; OD right eye; OS left eye; OU both eyes  XT exotropia; ET esotropia; PEK punctate epithelial keratitis; PEE punctate epithelial erosions; DES dry eye syndrome; MGD meibomian gland dysfunction; ATs artificial tears; PFAT's preservative free artificial tears; NSC nuclear sclerotic cataract; PSC posterior subcapsular cataract; ERM epi-retinal membrane; PVD posterior vitreous detachment; RD retinal detachment; DM diabetes mellitus; DR diabetic retinopathy; NPDR non-proliferative diabetic retinopathy; PDR proliferative diabetic retinopathy; CSME clinically significant macular edema; DME diabetic macular edema; dbh dot blot hemorrhages; CWS cotton wool spot; POAG primary open angle glaucoma; C/D cup-to-disc ratio; HVF humphrey visual field; GVF goldmann visual field; OCT optical coherence tomography; IOP intraocular pressure; BRVO Branch retinal vein occlusion; CRVO central retinal vein occlusion; CRAO central retinal artery occlusion; BRAO branch retinal artery occlusion; RT retinal tear; SB scleral buckle; PPV pars plana vitrectomy; VH Vitreous hemorrhage; PRP panretinal laser photocoagulation; IVK intravitreal kenalog; VMT vitreomacular traction; MH Macular hole;  NVD neovascularization of the disc; NVE neovascularization elsewhere; AREDS age related eye disease study; ARMD age related  macular degeneration; POAG primary open angle glaucoma; EBMD epithelial/anterior basement membrane dystrophy; ACIOL anterior chamber intraocular lens; IOL intraocular lens; PCIOL posterior chamber intraocular lens; Phaco/IOL phacoemulsification with intraocular lens placement; PRK photorefractive keratectomy; LASIK laser assisted in situ keratomileusis; HTN hypertension; DM diabetes mellitus; COPD chronic obstructive pulmonary disease

## 2020-12-21 ENCOUNTER — Telehealth: Payer: Self-pay | Admitting: Internal Medicine

## 2020-12-21 NOTE — Telephone Encounter (Signed)
Pt last seen dr Fabian Sharp 03-25-2012 as new pt. Pt was sch for cpe on  02-21-2021 and has rsc for cpe 05-09-2021. Please advise.

## 2020-12-22 NOTE — Telephone Encounter (Signed)
I am  not taking "new patients" because of limited hours and full panel .  Please help her establish with another provider. thanks

## 2020-12-24 NOTE — Telephone Encounter (Signed)
Pt is aware of the below message per [facility]

## 2021-02-21 ENCOUNTER — Encounter: Payer: BC Managed Care – PPO | Admitting: Internal Medicine

## 2021-04-14 ENCOUNTER — Encounter (HOSPITAL_COMMUNITY): Payer: Self-pay

## 2021-04-14 ENCOUNTER — Ambulatory Visit (INDEPENDENT_AMBULATORY_CARE_PROVIDER_SITE_OTHER): Payer: BC Managed Care – PPO

## 2021-04-14 ENCOUNTER — Ambulatory Visit (HOSPITAL_COMMUNITY)
Admission: EM | Admit: 2021-04-14 | Discharge: 2021-04-14 | Disposition: A | Discharge status: Home / Self Care | Payer: BC Managed Care – PPO | Attending: Emergency Medicine | Admitting: Emergency Medicine

## 2021-04-14 DIAGNOSIS — R0602 Shortness of breath: Secondary | ICD-10-CM | POA: Diagnosis not present

## 2021-04-14 DIAGNOSIS — J069 Acute upper respiratory infection, unspecified: Secondary | ICD-10-CM | POA: Diagnosis not present

## 2021-04-14 DIAGNOSIS — R059 Cough, unspecified: Secondary | ICD-10-CM

## 2021-04-14 DIAGNOSIS — J302 Other seasonal allergic rhinitis: Secondary | ICD-10-CM | POA: Diagnosis not present

## 2021-04-14 DIAGNOSIS — R079 Chest pain, unspecified: Secondary | ICD-10-CM

## 2021-04-14 MED ORDER — ALBUTEROL SULFATE HFA 108 (90 BASE) MCG/ACT IN AERS: 1.0000 | INHALATION_SPRAY | Freq: Four times a day (QID) | RESPIRATORY_TRACT | 0 refills | Status: DC | PRN

## 2021-04-14 NOTE — Discharge Instructions (Addendum)
Your cough could be related to allergies or a mild upper respiratory infection.  Your physical exam and chest x-ray showed no abnormalities.  Drink plenty of fluids and use nasal inhaler as previously ordered along with antihistamine daily over the next week. Use inhaler for whjeezing or shortness of breath at anytime Return at anytime for new or worsening symptoms for reevaluation ?

## 2021-04-14 NOTE — ED Triage Notes (Signed)
Pt presents with c/o chest discomfort. States she wants an x-ray. Reports she sometimes cannot take a full breath and starts coughing. States she inhaled "Round 1" two years ago.  ?

## 2021-04-14 NOTE — ED Provider Notes (Addendum)
?[facility] ? ? ?MRN: [id number] DOB: 02-10-1960 ? ?Subjective:  ? ?Chief Complaint;  ?Chief Complaint  ?Patient presents with  ? chest discomfort  ? Nasal Congestion  ? ?Pt presents with c/o chest discomfort. States she wants an x-ray. Reports she sometimes cannot take a full breath and starts coughing. States she inhaled "Round 1" two years ago.  ? ?Jeanne Porter is a 61 y.o. female presenting for mild lung congestion.  She denies fever and has a mild cough she states she inhaled Roundup 20 years ago and feels she has damaged her lungs at that time.  She also has a 24-pack-year smoking history.  She states she coughs scant amount of intermittently yellowish mucus.  She was requesting a chest x-ray to evaluate the lung damage from the Roundup. ? ?No current facility-administered medications for this encounter. ? ?Current Outpatient Medications:  ?  albuterol (VENTOLIN HFA) 108 (90 Base) MCG/ACT inhaler, Inhale 1-2 puffs into the lungs every 6 (six) hours as needed for wheezing or shortness of breath., Disp: 18 g, Rfl: 0 ?  cephALEXin (KEFLEX) 500 MG capsule, Take 1 capsule (500 mg total) by mouth 2 (two) times daily., Disp: 21 capsule, Rfl: 0 ?  fluticasone (FLONASE) 50 MCG/ACT nasal spray, Take 1 tablet by mouth as needed., Disp: , Rfl:  ?  PRESCRIPTION MEDICATION, Place 1 application onto the skin daily. Estrogen replacement cream., Disp: , Rfl:   ? ?No Known Allergies ? ?Past Medical History:  ?Diagnosis Date  ? Elevated glucose   ? Exertional headache   ? had ed eval and had small  bleed ;felt to be able to follow  and recheck as needed per neuro   ? Fatigue   ? Gallstones   ? reported "cleansing " and passes many stones and no more sx currrently   ? GERD (gastroesophageal reflux disease)   ? Hx of right knee surgery   ? acl  ? Hx of varicella   ? Hyperlipidemia   ? Seasonal allergies   ?  ? ?Review of Systems  ?All other systems reviewed and are negative. ? ? ?Objective:  ? ?Vitals: ?BP  127/72 (BP Location: Left Arm)   Pulse 72   Temp 98.9 ?F (37.2 ?C) (Oral)   Resp 17   SpO2 100%  ? ?Physical Exam ?Constitutional:   ?   General: She is not in acute distress. ?   Appearance: Normal appearance. She is not toxic-appearing.  ?HENT:  ?   Head: Atraumatic.  ?   Right Ear: External ear normal.  ?   Left Ear: External ear normal.  ?   Nose: Nose normal.  ?   Mouth/Throat:  ?   Mouth: Mucous membranes are moist.  ?Eyes:  ?   General: No scleral icterus.    ?   Right eye: No discharge.     ?   Left eye: No discharge.  ?   Conjunctiva/sclera: Conjunctivae normal.  ?Cardiovascular:  ?   Rate and Rhythm: Normal rate and regular rhythm.  ?Pulmonary:  ?   Effort: Pulmonary effort is normal.  ?   Breath sounds: Normal breath sounds.  ?Abdominal:  ?   General: There is no distension.  ?   Palpations: Abdomen is soft.  ?   Tenderness: There is no abdominal tenderness. There is no right CVA tenderness, left CVA tenderness, guarding or rebound.  ?Skin: ?   General: Skin is warm and dry.  ?  Capillary Refill: Capillary refill takes less than 2 seconds.  ?Neurological:  ?   General: No focal deficit present.  ?   Mental Status: She is alert and oriented to person, place, and time.  ?Psychiatric:     ?   Mood and Affect: Mood normal.  ? ? ?No results found for this or any previous visit (from the past 24 hour(s)). ? ?DG Chest 2 View ? ?Result Date: 04/14/2021 ?CLINICAL DATA:  61 year old female with history of shortness of breath and chest pain. EXAM: CHEST - 2 VIEW COMPARISON:  No priors. FINDINGS: Lung volumes are normal. No consolidative airspace disease. No pleural effusions. No pneumothorax. No pulmonary nodule or mass noted. Pulmonary vasculature and the cardiomediastinal silhouette are within normal limits. IMPRESSION: No radiographic evidence of acute cardiopulmonary disease. Electronically Signed   By: Vinnie Langton M.D.   On: 04/14/2021 10:53    ?  ? ?Assessment and Plan :  ? ?1. Viral upper respiratory  tract infection   ?2. Seasonal allergies   ? ? ?Meds ordered this encounter  ?Medications  ? albuterol (VENTOLIN HFA) 108 (90 Base) MCG/ACT inhaler  ?  Sig: Inhale 1-2 puffs into the lungs every 6 (six) hours as needed for wheezing or shortness of breath.  ?  Dispense:  18 g  ?  Refill:  0  ? ? ?MDM:  ?Jeanne Porter is a 61 y.o. female presenting for concern for mild cough and congestion.  She denies nasal congestion or symptoms of allergies she also denies fever.  Her greatest concern was that she may have lung damage from inhaling Roundup 20 years ago.  On exam patient has clear lungs and is speaking in full paragraphs without any signs of shortness of breath.  Her vital signs are unremarkable.  Chest x-ray shows no abnormalities.  I reassured patient that there was no obvious scarring of her lungs and given her few bouts with lung issues there is no evidence that she had any significant lung damage due to Roundup.  I discussed treatment, follow up and return instructions. Questions were answered. Patient stated understanding of instructions and is stable for discharge. ? ?Leida Lauth FNP-C MCN  ?  ?Hezzie Bump, NP ?04/14/21 1106 ? ?  ?Hezzie Bump, NP ?04/14/21 1710 ? ?

## 2021-05-09 ENCOUNTER — Encounter: Payer: BC Managed Care – PPO | Admitting: Internal Medicine

## 2021-07-29 ENCOUNTER — Telehealth: Payer: Self-pay | Admitting: Internal Medicine

## 2021-07-29 NOTE — Telephone Encounter (Signed)
Pt inquired about coverage through her insurance. [name] was not listed. Sent an email to credentialing.

## 2021-09-12 ENCOUNTER — Ambulatory Visit: Payer: BC Managed Care – PPO | Admitting: Family Medicine

## 2021-09-13 ENCOUNTER — Ambulatory Visit: Payer: BC Managed Care – PPO | Admitting: Family Medicine

## 2021-09-23 ENCOUNTER — Ambulatory Visit: Payer: BC Managed Care – PPO | Admitting: Family Medicine

## 2021-09-23 ENCOUNTER — Encounter: Payer: Self-pay | Admitting: Family Medicine

## 2021-09-23 VITALS — BP 122/84 | HR 72 | Temp 98.1°F | Ht 62.75 in | Wt 181.2 lb

## 2021-09-23 DIAGNOSIS — Z1159 Encounter for screening for other viral diseases: Secondary | ICD-10-CM | POA: Diagnosis not present

## 2021-09-23 DIAGNOSIS — R7303 Prediabetes: Secondary | ICD-10-CM

## 2021-09-23 DIAGNOSIS — E782 Mixed hyperlipidemia: Secondary | ICD-10-CM | POA: Diagnosis not present

## 2021-09-23 DIAGNOSIS — Z1211 Encounter for screening for malignant neoplasm of colon: Secondary | ICD-10-CM

## 2021-09-23 DIAGNOSIS — Z0000 Encounter for general adult medical examination without abnormal findings: Secondary | ICD-10-CM | POA: Diagnosis not present

## 2021-09-23 NOTE — Patient Instructions (Signed)
Welcome to [facility] at [place]! It was a pleasure meeting you today. ° °As discussed, Please schedule a 12 month follow up visit today. ° °PLEASE NOTE: ° °If you had any LAB tests please let us know if you have not heard back within a few days. You may see your results on MyChart before we have a chance to review them but we will give you a call once they are reviewed by us. If we ordered any REFERRALS today, please let us know if you have not heard from their office within the next week.  °Let us know through MyChart if you are needing REFILLS, or have your pharmacy send us the request. You can also use MyChart to communicate with me or any office staff. ° °Please try these tips to maintain a healthy lifestyle: ° °Eat most of your calories during the day when you are active. Eliminate processed foods including packaged sweets (pies, cakes, cookies), reduce intake of potatoes, white bread, white pasta, and white rice. Look for whole grain options, oat flour or almond flour. ° °Each meal should contain half fruits/vegetables, one quarter protein, and one quarter carbs (no bigger than a computer mouse). ° °Cut down on sweet beverages. This includes juice, soda, and sweet tea. Also watch fruit intake, though this is a healthier sweet option, it still contains natural sugar! Limit to 3 servings daily. ° °Drink at least 1 glass of water with each meal and aim for at least 8 glasses per day ° °Exercise at least 150 minutes every week.   °

## 2021-09-23 NOTE — Progress Notes (Unsigned)
Phone [phone]   Subjective:   Patient is a 61 y.o. female presenting for annual physical.    Chief Complaint  Patient presents with   Establish Care    Need new pcp Need a annual exam      Annual-new pt. Paediatric nurse.  ?predm-worked on diet/exercise. Taking Berberine Former [employer]. Doesn't take rx meds.  Tries alternative meds.  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  Seasonal allergies-takes nasacort occ.  Eyes: no blurry vision, double vision Resp: no cough, wheeze,SOB CV: no CP, palpitations, LE edema,  GI: no heartburn, n/v/d/c, abd pain GU: no dysuria, urgency, frequency, hematuria QQI:WLNL. Neuro: no dizziness, weakness, vertigo.  Occ HA when overexcited.  Psych: no depression, anxiety, insomnia, SI   The following were reviewed and entered/updated in epic: Past Medical History:  Diagnosis Date   Cataract    Elevated glucose    Exertional headache    had ed eval and had small  bleed ;felt to be able to follow  and recheck as needed per neuro    Fatigue    Gallstones    reported "cleansing " and passes many stones and no more sx currrently    GERD (gastroesophageal reflux disease)    Hx of right knee surgery    acl   Hx of varicella    Hyperlipidemia    ICB (intracranial bleed) (HCC)    h/o-mild   Seasonal allergies    Patient Active Problem List   Diagnosis Date Noted   Prediabetes 09/24/2021   Operculated retinal tear, left 08/23/2020   Posterior vitreous detachment of both eyes 08/23/2020   Nuclear sclerotic cataract of both eyes 08/23/2020   Postmenopausal HRT (hormone replacement therapy) 03/31/2012   Gallstones    Seasonal allergies    GERD (gastroesophageal reflux disease)    Mixed hyperlipidemia 09/26/2010   Past Surgical History:  Procedure  Laterality Date   ANTERIOR CRUCIATE LIGAMENT REPAIR  01/02/1993   right knee   EYE SURGERY Left 2015   torn retina    Family History  Problem Relation Age of Onset   Diabetes Other    Heart disease Other    Heart failure Other    Heart attack Other    Hyperlipidemia Mother    Alcohol abuse Father    Hyperlipidemia Maternal Grandmother    Heart disease Maternal Grandmother    Diabetes Maternal Grandfather    Heart disease Paternal Grandmother     Medications- reviewed and updated Current Outpatient Medications  Medication Sig Dispense Refill   Ascorbic Acid (VITAMIN C PO) Take 1 tablet by mouth. Occ.     Berberine Chloride (BERBERINE HCI PO) Take 1 tablet by mouth daily.     Calcium Carb-Cholecalciferol (CALCIUM 1000 + D PO) Take 1 tablet by mouth daily.     DIGESTIVE ENZYMES PO Take 1 capsule by mouth daily.     Glucosamine HCl (GLUCOSAMINE PO) Take 1 tablet by mouth daily.     Multiple Vitamins-Minerals (MULTIVITAMIN WOMEN 50+ PO) Take 1 tablet by mouth daily.     Povidone-Iodine (IODINE SOLUTION EX) Apply topically. Once a week     Probiotic Product (PROBIOTIC PO) Take 1 tablet by mouth daily.     Triamcinolone Acetonide (NASACORT ALLERGY 24HR NA) Place into the nose. 1-2 times a week     No current facility-administered  medications for this visit.    Allergies-reviewed and updated No Known Allergies  Social History   Social History Narrative   Lobbyist and services-self employed.    Objective  Objective:  BP 122/84   Pulse 72   Temp 98.1 F (36.7 C) (Temporal)   Ht 5' 2.75" (1.594 m)   Wt 181 lb 4 oz (82.2 kg)   SpO2 97%   BMI 32.36 kg/m  Physical Exam  Gen: WDWN NAD wf HEENT: NCAT, conjunctiva not injected, sclera nonicteric TM WNL B, OP moist, no exudates  NECK:  supple, no thyromegaly, no nodes, no carotid bruits CARDIAC: RRR, S1S2+, no murmur. DP 2+B LUNGS: CTAB. No wheezes ABDOMEN:  BS+, soft, NTND, No HSM, no masses EXT:  no  edema MSK: no gross abnormalities. MS 5/5 all 4 NEURO: A&O x3.  CN II-XII intact.  PSYCH: normal mood. Good eye contact    Assessment and Plan   Health Maintenance counseling: 1. Anticipatory guidance: Patient counseled regarding regular dental exams q6 months, eye exams,  avoiding smoking and second hand smoke, limiting alcohol to 1 beverage per day, no illicit drugs.   2. Risk factor reduction:  Advised patient of need for regular exercise and diet rich and fruits and vegetables to reduce risk of heart attack and stroke. Exercise- work on it..  Wt Readings from Last 3 Encounters:  09/23/21 181 lb 4 oz (82.2 kg)  03/25/12 182 lb (82.6 kg)  09/26/10 192 lb (87.1 kg)   3. Immunizations/screenings/ancillary studies Immunization History  Administered Date(s) Administered   Janssen (J&J) SARS-COV-2 Vaccination 04/11/2019, 11/06/2019   Pfizer Covid-19 Vaccine Bivalent Booster 41yrs & up 10/09/2020   Td 01/05/2003   Tdap 05/27/2014   Health Maintenance Due  Topic Date Due   Hepatitis C Screening  Never done   COLONOSCOPY (Pts 45-1yrs Insurance coverage will need to be confirmed)  Never done    4. Cervical cancer screening- pt will sch w/gyn 5. Breast cancer screening-  [facility]ammogram will sch [facility] 6. Colon cancer screening - ordered cologard  Problem List Items Addressed This Visit       Other   Mixed hyperlipidemia   Prediabetes   Other Visit Diagnoses     Wellness examination    -  Primary   Relevant Orders   CBC   Comprehensive metabolic panel   Hemoglobin A1c   Lipid panel   TSH   Hepatitis C antibody   Screen for colon cancer       Relevant Orders   Cologuard   Encounter for hepatitis C screening test for low risk patient       Relevant Orders   Hepatitis C antibody      Wellness-anticipatory guidance.  Work on [employer].  Check CBC,CMP,lipids,TSH, A1C.  F/u 1 yr .  Ordreed cologard.  She will get mamm [facility] and pap gyn PreDM-working on diet/exercise, taking  Berberine.  Check A1C HLD-mixed, familial-working on diet/exercise.  Check lipid  Recommended follow up: annualReturn in about 1 year (around 09/24/2022) for annual in 1 yr.    soon fasting labs. Future Appointments  Date Time Provider Department Center  09/26/2021  9:15 AM [facility]  02/23/2022  9:00 AM Romualdo Bolk, MD [facility] None  03/14/2022  8:00 AM Rankin, Alford Highland, MD [facility] None  10/05/2022  8:00 AM Jeani Sow, MD [facility]    Lab/Order associations:return fasting   ICD-10-CM   1. Wellness examination  Z00.00 CBC  Comprehensive metabolic panel    Hemoglobin A1c    Lipid panel    TSH    Hepatitis C antibody    2. Screen for colon cancer  Z12.11 Cologuard    3. Encounter for hepatitis C screening test for low risk patient  Z11.59 Hepatitis C antibody    4. Mixed hyperlipidemia  E78.2     5. Prediabetes  R73.03       No orders of the defined types were placed in this encounter.   Angelena Sole, MD

## 2021-09-24 DIAGNOSIS — R7303 Prediabetes: Secondary | ICD-10-CM | POA: Insufficient documentation

## 2021-09-26 ENCOUNTER — Other Ambulatory Visit (INDEPENDENT_AMBULATORY_CARE_PROVIDER_SITE_OTHER): Payer: BC Managed Care – PPO

## 2021-09-26 DIAGNOSIS — Z1159 Encounter for screening for other viral diseases: Secondary | ICD-10-CM

## 2021-09-26 DIAGNOSIS — Z0000 Encounter for general adult medical examination without abnormal findings: Secondary | ICD-10-CM | POA: Diagnosis not present

## 2021-09-26 LAB — COMPREHENSIVE METABOLIC PANEL
ALT: 15 U/L (ref 0–35)
AST: 16 U/L (ref 0–37)
Albumin: 3.9 g/dL (ref 3.5–5.2)
Alkaline Phosphatase: 72 U/L (ref 39–117)
BUN: 15 mg/dL (ref 6–23)
CO2: 26 mEq/L (ref 19–32)
Calcium: 8.6 mg/dL (ref 8.4–10.5)
Chloride: 104 mEq/L (ref 96–112)
Creatinine, Ser: 0.67 mg/dL (ref 0.40–1.20)
GFR: 94.96 mL/min (ref >60.00)
Glucose, Bld: 103 mg/dL — ABNORMAL HIGH (ref 70–99)
Potassium: 4.3 mEq/L (ref 3.5–5.1)
Sodium: 138 mEq/L (ref 135–145)
Total Bilirubin: 0.3 mg/dL (ref 0.2–1.2)
Total Protein: 7 g/dL (ref 6.0–8.3)

## 2021-09-26 LAB — TSH: TSH: 3.14 u[IU]/mL (ref 0.35–5.50)

## 2021-09-26 LAB — CBC
HCT: 40.8 % (ref 36.0–46.0)
Hemoglobin: 13.6 g/dL (ref 12.0–15.0)
MCHC: 33.4 g/dL (ref 30.0–36.0)
MCV: 91.4 fl (ref 78.0–100.0)
Platelets: 261 10*3/uL (ref 150.0–400.0)
RBC: 4.46 Mil/uL (ref 3.87–5.11)
RDW: 12.5 % (ref 11.5–15.5)
WBC: 7.8 10*3/uL (ref 4.0–10.5)

## 2021-09-26 LAB — LIPID PANEL
Cholesterol: 200 mg/dL (ref 0–200)
HDL: 47.2 mg/dL (ref >39.00)
NonHDL: 152.73
Total CHOL/HDL Ratio: 4
Triglycerides: 201 mg/dL — ABNORMAL HIGH (ref 0.0–149.0)
VLDL: 40.2 mg/dL — ABNORMAL HIGH (ref 0.0–40.0)

## 2021-09-26 LAB — HEMOGLOBIN A1C: Hgb A1c MFr Bld: 6.1 % (ref 4.6–6.5)

## 2021-09-26 LAB — LDL CHOLESTEROL, DIRECT: Direct LDL: 137 mg/dL

## 2021-09-27 LAB — HEPATITIS C ANTIBODY: Hepatitis C Ab: NONREACTIVE

## 2021-10-10 DIAGNOSIS — Z1211 Encounter for screening for malignant neoplasm of colon: Secondary | ICD-10-CM | POA: Diagnosis not present

## 2021-10-16 ENCOUNTER — Ambulatory Visit (HOSPITAL_COMMUNITY)
Admission: EM | Admit: 2021-10-16 | Discharge: 2021-10-16 | Disposition: A | Discharge status: Home / Self Care | Payer: BC Managed Care – PPO | Attending: Internal Medicine | Admitting: Internal Medicine

## 2021-10-16 ENCOUNTER — Encounter (HOSPITAL_COMMUNITY): Payer: Self-pay

## 2021-10-16 DIAGNOSIS — M2550 Pain in unspecified joint: Secondary | ICD-10-CM

## 2021-10-16 DIAGNOSIS — W540XXA Bitten by dog, initial encounter: Secondary | ICD-10-CM | POA: Diagnosis not present

## 2021-10-16 DIAGNOSIS — M130 Polyarthritis, unspecified: Secondary | ICD-10-CM | POA: Diagnosis not present

## 2021-10-16 MED ORDER — PREDNISONE 20 MG PO TABS: 40.0000 mg | ORAL_TABLET | Freq: Every day | ORAL | 0 refills | Status: AC

## 2021-10-16 MED ORDER — AMOXICILLIN-POT CLAVULANATE 875-125 MG PO TABS: 1.0000 | ORAL_TABLET | Freq: Two times a day (BID) | ORAL | 0 refills | Status: DC

## 2021-10-16 NOTE — ED Provider Notes (Signed)
[facility]    CSN: [id number] Arrival date & time: 10/16/21  1029      History   Chief Complaint Chief Complaint  Patient presents with   Joint Pain    HPI Jeanne Porter is a 61 y.o. female.   Patient presents urgent care for evaluation of pain to multiple joints particularly the bilateral shoulders and to the right knee that started approximately 2 weeks ago.  She works in [employer] and does multiple small tasks around her home that require physical exertion.  Recently, she pressure washed a friend's house with an 18 foot pressure washing wand and states that this exacerbated her bilateral shoulder pain significantly.  Reports low-grade fevers with the highest recording 99.6 couple of nights ago with associated night sweats since symptom onset.  She has been taking 800 mg of ibuprofen twice daily over the last 2 weeks and states that this is the only way that she has been able to get out of the bed due to feeling sore.  Joint pain is worse with movement and slightly relieved by rest.  Denies history of rheumatoid, osteo, or inflammatory arthritis diagnosis, although positive family history of rheumatoid arthritis. No history of other autoimmune disorders. Of note, patient suffered from dog bites to the bilateral lower extremities 19 days ago.  Dog is well-known to her and is up-to-date on all of their vaccines.  She has been cleansing the wounds with warm soap and water and attempt to prevent infection and denies drainage from the sites.  She is concerned that dog bites may be contributing to her joint pain, fevers, and night sweats.  Denies recent antibiotic use. She does enjoy drinking 1 drink of scotch and 1-2 beers and states this further relieves the pain 3-4 times per week. She does not experience withdrawal symptoms if she stops drinking alcohol.  States she generally prefers to use natural and holistic methods of symptom relief, but just establish care with a primary  care provider for health prevention and maintenance.      Past Medical History:  Diagnosis Date   Cataract    Elevated glucose    Exertional headache    had ed eval and had small  bleed ;felt to be able to follow  and recheck as needed per neuro    Fatigue    Gallstones    reported "cleansing " and passes many stones and no more sx currrently    GERD (gastroesophageal reflux disease)    Hx of right knee surgery    acl   Hx of varicella    Hyperlipidemia    ICB (intracranial bleed) (HCC)    h/o-mild   Seasonal allergies     Patient Active Problem List   Diagnosis Date Noted   Prediabetes 09/24/2021   Operculated retinal tear, left 08/23/2020   Posterior vitreous detachment of both eyes 08/23/2020   Nuclear sclerotic cataract of both eyes 08/23/2020   Postmenopausal HRT (hormone replacement therapy) 03/31/2012   Gallstones    Seasonal allergies    GERD (gastroesophageal reflux disease)    Mixed hyperlipidemia 09/26/2010    Past Surgical History:  Procedure Laterality Date   ANTERIOR CRUCIATE LIGAMENT REPAIR  01/02/1993   right knee   EYE SURGERY Left 2015   torn retina    OB History   No obstetric history on file.      Home Medications    Prior to Admission medications   Medication Sig Start Date End  Date Taking? Authorizing Provider  amoxicillin-clavulanate (AUGMENTIN) 875-125 MG tablet Take 1 tablet by mouth every 12 (twelve) hours. 10/16/21  Yes Carlisle Beers, FNP  Ascorbic Acid (VITAMIN C PO) Take 1 tablet by mouth. Occ.   Yes [provider]  Berberine Chloride (BERBERINE HCI PO) Take 1 tablet by mouth daily.   Yes [provider]  Calcium Carb-Cholecalciferol (CALCIUM 1000 + D PO) Take 1 tablet by mouth daily.   Yes [provider]  DIGESTIVE ENZYMES PO Take 1 capsule by mouth daily.   Yes [provider]  Glucosamine HCl (GLUCOSAMINE PO) Take 1 tablet by mouth daily.   Yes [provider]  Multiple  Vitamins-Minerals (MULTIVITAMIN WOMEN 50+ PO) Take 1 tablet by mouth daily.   Yes [provider]  Povidone-Iodine (IODINE SOLUTION EX) Apply topically. Once a week   Yes [provider]  predniSONE (DELTASONE) 20 MG tablet Take 2 tablets (40 mg total) by mouth daily for 5 days. 10/16/21 10/21/21 Yes Carlisle Beers, FNP  Probiotic Product (PROBIOTIC PO) Take 1 tablet by mouth daily.   Yes [provider]  Triamcinolone Acetonide (NASACORT ALLERGY 24HR NA) Place into the nose. 1-2 times a week   Yes [provider]    Family History Family History  Problem Relation Age of Onset   Diabetes Other    Heart disease Other    Heart failure Other    Heart attack Other    Hyperlipidemia Mother    Alcohol abuse Father    Hyperlipidemia Maternal Grandmother    Heart disease Maternal Grandmother    Diabetes Maternal Grandfather    Heart disease Paternal Grandmother     Social History Social History   Tobacco Use   Smoking status: Former    Types: Cigarettes    Passive exposure: Past   Smokeless tobacco: Never   Tobacco comments:    quit 1994  Vaping Use   Vaping Use: Never used  Substance Use Topics   Alcohol use: Yes    Alcohol/week: 6.0 - 8.0 standard drinks of alcohol    Types: 3 - 4 Cans of beer, 3 - 4 Shots of liquor per week   Drug use: Not Currently    Types: Marijuana    Comment: used in the past     Allergies   Patient has no known allergies.   Review of Systems Review of Systems Per HPI  Physical Exam Triage Vital Signs ED Triage Vitals  Enc Vitals Group     BP 10/16/21 1101 (!) 136/90     Pulse Rate 10/16/21 1101 71     Resp 10/16/21 1101 16     Temp 10/16/21 1101 98.8 F (37.1 C)     Temp Source 10/16/21 1101 Oral     SpO2 10/16/21 1101 98 %     Weight --      Height --      Head Circumference --      Peak Flow --      Pain Score 10/16/21 1059 10     Pain Loc --      Pain Edu? --      Excl. in [place]? --     No data found.  Updated Vital Signs BP (!) 136/90 (BP Location: Left Arm)   Pulse 71   Temp 98.8 F (37.1 C) (Oral)   Resp 16   SpO2 98%   Visual Acuity Right Eye Distance:   Left Eye Distance:   Bilateral  Distance:    Right Eye Near:   Left Eye Near:    Bilateral Near:     Physical Exam Vitals and nursing note reviewed.  Constitutional:      Appearance: She is not ill-appearing or toxic-appearing.  HENT:     Head: Normocephalic and atraumatic.     Right Ear: Hearing and external ear normal.     Left Ear: Hearing and external ear normal.     Nose: Nose normal.     Mouth/Throat:     Lips: Pink.  Eyes:     General: Lids are normal. Vision grossly intact. Gaze aligned appropriately.     Extraocular Movements: Extraocular movements intact.     Conjunctiva/sclera: Conjunctivae normal.  Pulmonary:     Effort: Pulmonary effort is normal.  Musculoskeletal:        General: No swelling, deformity or signs of injury.     Right shoulder: Tenderness present. No swelling or crepitus. Decreased range of motion. Normal strength. Normal pulse.     Left shoulder: Tenderness present. No swelling or crepitus. Decreased range of motion. Normal strength. Normal pulse.     Cervical back: Neck supple.     Right lower leg: No edema.     Left lower leg: No edema.     Comments: Generalized tenderness to the bilateral shoulders and bilateral anterior knees.  Normal range of motion present to the bilateral lower extremities with 5/5 strength against resistance to the lower extremities.  5/5 grip strength of the bilateral upper extremities and strength against resistance to the upper extremities.  No warmth, swelling, or erythema to tender joints.  Range of motion to the bilateral upper extremities at the shoulder joints are mildly limited due to tenderness.  Skin:    General: Skin is warm and dry.     Capillary Refill: Capillary refill takes less than 2 seconds.     Findings: Wound present. No  rash.          Comments: Slight erythema surrounding both dog bites without warmth or obvious drainage. See images below for further detail.  Neurological:     General: No focal deficit present.     Mental Status: She is alert and oriented to person, place, and time. Mental status is at baseline.     Cranial Nerves: No dysarthria or facial asymmetry.  Psychiatric:        Mood and Affect: Mood normal.        Speech: Speech normal.        Behavior: Behavior normal.        Thought Content: Thought content normal.        Judgment: Judgment normal.   Bite 1   Bite 2    UC Treatments / Results  Labs (all labs ordered are listed, but only abnormal results are displayed) Labs Reviewed - No data to display  EKG   Radiology No results found.  Procedures Procedures (including critical care time)  Medications Ordered in UC Medications - No data to display  Initial Impression / Assessment and Plan / UC Course  I have reviewed the triage vital signs and the nursing notes.  Pertinent labs & imaging results that were available during my care of the patient were reviewed by me and considered in my medical decision making (see chart for details).   1. Polyarthralgia and dog bites Labs reviewed from 2 weeks ago on September 26, 2021 at primary care physical visit appear normal and without concern for inflammatory  or infectious process contributing to joint pain.  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  Over-the-counter ibuprofen 800 mg twice daily has not resulted in symptomatic relief over the last 2 weeks.  Therefore, we will provide prednisone burst 40 mg once daily for the next 5 days with breakfast to reduce pain and inflammation.  Advised patient to avoid taking ibuprofen while taking prednisone due to increased risk of GI bleeding.  She may purchase an over-the-counter proton pump inhibitor such as Nexium to take while she  is on the prednisone to protect the stomach lining related to recent increase use of NSAIDs and steroids.  Wounds from dog bites may be playing a role in arthralgias and appear mildly infected today.  We will cover with Augmentin twice daily for 7 days to treat bacteria related to dog bites.  Advised patient to perform gentle range of motion exercises and rest over the next few days to allow her body to heal.  She may follow-up with her PCP in the next 1 to 2 weeks if symptoms fail to improve.  She may benefit from either physical therapy or an orthopedic referral in the future if symptoms fail to improve or progressively worsen.  Strict ED return precautions given.  Discussed physical exam and available lab work findings in clinic with patient.  Counseled patient regarding appropriate use of medications and potential side effects for all medications recommended or prescribed today. Discussed red flag signs and symptoms of worsening condition,when to call the PCP office, return to urgent care, and when to seek higher level of care in the emergency department. Patient verbalizes understanding and agreement with plan. All questions answered. Patient discharged in stable condition.    Final Clinical Impressions(s) / UC Diagnoses   Final diagnoses:  Dog bite, initial encounter  Polyarthralgia     Discharge Instructions      Take prednisone 40 mg once daily for the next 5 days.  You may start taking this when you get home from urgent care today with food.  Do not take prednisone with ibuprofen as it can increase your risk of GI bleeding. I have sent in Nexium to the pharmacy for you to take for the next 5 to 7 days to protect your stomach since you have been taking so much ibuprofen lately while you take the prednisone.   Provide ice to the joints where you are having pain.  You may also try heat and gentle range of motion exercises as well.  Take Augmentin twice daily for the next 7 days to treat  infection to the dog bites.  Have a great trip to [place] in a couple weeks!    ED Prescriptions     Medication Sig Dispense Auth. Provider   predniSONE (DELTASONE) 20 MG tablet Take 2 tablets (40 mg total) by mouth daily for 5 days. 10 tablet Talbot Grumbling, FNP   amoxicillin-clavulanate (AUGMENTIN) 875-125 MG tablet Take 1 tablet by mouth every 12 (twelve) hours. 14 tablet Talbot Grumbling, FNP      PDMP not reviewed this encounter.   Talbot Grumbling, [place] 10/16/21 2105

## 2021-10-16 NOTE — Discharge Instructions (Addendum)
Take prednisone 40 mg once daily for the next 5 days.  You may start taking this when you get home from urgent care today with food.  Do not take prednisone with ibuprofen as it can increase your risk of GI bleeding. I have sent in Nexium to the pharmacy for you to take for the next 5 to 7 days to protect your stomach since you have been taking so much ibuprofen lately while you take the prednisone.   Provide ice to the joints where you are having pain.  You may also try heat and gentle range of motion exercises as well.  Take Augmentin twice daily for the next 7 days to treat infection to the dog bites.  Have a great trip to [place] in a couple weeks!

## 2021-10-16 NOTE — ED Triage Notes (Addendum)
Pain in the right knee. Having overall joint pain.   Patient was bit by a dog 19 days ago. The dog is up to date one all shots. Was bitten on the legs. Unsure if it is related. Was not seen for it.

## 2021-10-18 LAB — COLOGUARD: COLOGUARD: NEGATIVE

## 2021-10-24 ENCOUNTER — Ambulatory Visit (HOSPITAL_COMMUNITY)
Admission: RE | Admit: 2021-10-24 | Discharge: 2021-10-24 | Disposition: A | Discharge status: Home / Self Care | Payer: BC Managed Care – PPO | Source: Ambulatory Visit

## 2021-10-24 ENCOUNTER — Encounter (HOSPITAL_COMMUNITY): Payer: Self-pay

## 2021-10-24 VITALS — BP 125/87 | HR 80 | Temp 98.6°F | Resp 16

## 2021-10-24 DIAGNOSIS — M2550 Pain in unspecified joint: Secondary | ICD-10-CM

## 2021-10-24 MED ORDER — IBUPROFEN 600 MG PO TABS: 600.0000 mg | ORAL_TABLET | Freq: Four times a day (QID) | ORAL | 0 refills | Status: DC | PRN

## 2021-10-24 MED ORDER — DEXAMETHASONE SODIUM PHOSPHATE 10 MG/ML IJ SOLN
10.0000 mg | Freq: Once | INTRAMUSCULAR | Status: AC
  Administered 2021-10-24: 10 mg via INTRAMUSCULAR

## 2021-10-24 MED ORDER — DEXAMETHASONE SODIUM PHOSPHATE 10 MG/ML IJ SOLN
INTRAMUSCULAR | Status: AC
  Filled 2021-10-24: qty 1

## 2021-10-24 NOTE — ED Triage Notes (Signed)
Inflammation and pain in the right knee mainly but pain is in both.  Having continued overall joint pain. Was seen 10/16/21 and has gotten no better with medication.  States her muscles in the legs will get tight along with the joint pain to the point that she can barley walk.  Patient has been taking the prednisone. States by the second day on prednisone 40 mg she could not function so she did not take this on day 3. States she then reduced her dose of Prednisone 10 mg every morning and has been taking that.

## 2021-10-24 NOTE — ED Provider Notes (Signed)
[place]    CSN: [id number] Arrival date & time: 10/24/21  1848      History   Chief Complaint Chief Complaint  Patient presents with   Joint Pain   1900 Appt    HPI Jeanne Porter is a 60 y.o. female.   Jeanne Porter is a 61 year old female that presents with complains of arthralgias and myalgias, which have/has been present for 3 weeks. Pain is located in multiple joints. The pain is described as intermittent, aching and tightness. Associated symptoms include decreased range of motion, instability, and tenderness. Patient reports that symptoms are worse when getting up in the mornings. Symptoms get some better once she starts moving around. The patient has tried alternative medications (sees holistic provider), heat, and rest for pain, with minimal relief. She denies any related injury. Patient was seen here on 10/16/21 for the same. She was placed on prednisone 40 mg daily x 5 days but decreased the prescribed dose down to 10 mg daily due to unpleasant side effects. She saw her holistic doctor earlier today and had blood work to evaluate for possible rheumatoid arthritis. Patient reports a family history of RA.         Past Medical History:  Diagnosis Date   Cataract    Elevated glucose    Exertional headache    had ed eval and had small  bleed ;felt to be able to follow  and recheck as needed per neuro    Fatigue    Gallstones    reported "cleansing " and passes many stones and no more sx currrently    GERD (gastroesophageal reflux disease)    Hx of right knee surgery    acl   Hx of varicella    Hyperlipidemia    ICB (intracranial bleed) (HCC)    h/o-mild   Seasonal allergies     Patient Active Problem List   Diagnosis Date Noted   Prediabetes 09/24/2021   Operculated retinal tear, left 08/23/2020   Posterior vitreous detachment of both eyes 08/23/2020   Nuclear sclerotic cataract of both eyes 08/23/2020   Postmenopausal HRT (hormone  replacement therapy) 03/31/2012   Gallstones    Seasonal allergies    GERD (gastroesophageal reflux disease)    Mixed hyperlipidemia 09/26/2010    Past Surgical History:  Procedure Laterality Date   ANTERIOR CRUCIATE LIGAMENT REPAIR  01/02/1993   right knee   EYE SURGERY Left 2015   torn retina    OB History   No obstetric history on file.      Home Medications    Prior to Admission medications   Medication Sig Start Date End Date Taking? Authorizing Provider  ibuprofen (ADVIL) 600 MG tablet Take 1 tablet (600 mg total) by mouth every 6 (six) hours as needed for moderate pain or mild pain (take with food). 10/24/21  Yes Enrique Sack, FNP  amoxicillin-clavulanate (AUGMENTIN) 875-125 MG tablet Take 1 tablet by mouth every 12 (twelve) hours. 10/16/21   Talbot Grumbling, FNP  Ascorbic Acid (VITAMIN C PO) Take 1 tablet by mouth. Occ.    [provider]  Berberine Chloride (BERBERINE HCI PO) Take 1 tablet by mouth daily.    [provider]  Calcium Carb-Cholecalciferol (CALCIUM 1000 + D PO) Take 1 tablet by mouth daily.    [provider]  DIGESTIVE ENZYMES PO Take 1 capsule by mouth daily.    [provider]  Glucosamine HCl (GLUCOSAMINE PO) Take 1 tablet by mouth  daily.    [provider]  Multiple Vitamins-Minerals (MULTIVITAMIN WOMEN 50+ PO) Take 1 tablet by mouth daily.    [provider]  Povidone-Iodine (IODINE SOLUTION EX) Apply topically. Once a week    [provider]  Probiotic Product (PROBIOTIC PO) Take 1 tablet by mouth daily.    [provider]  Triamcinolone Acetonide (NASACORT ALLERGY 24HR NA) Place into the nose. 1-2 times a week    [provider]    Family History Family History  Problem Relation Age of Onset   Diabetes Other    Heart disease Other    Heart failure Other    Heart attack Other    Hyperlipidemia Mother    Alcohol abuse Father    Hyperlipidemia Maternal  Grandmother    Heart disease Maternal Grandmother    Diabetes Maternal Grandfather    Heart disease Paternal Grandmother     Social History Social History   Tobacco Use   Smoking status: Former    Types: Cigarettes    Passive exposure: Past   Smokeless tobacco: Never   Tobacco comments:    quit 1994  Vaping Use   Vaping Use: Never used  Substance Use Topics   Alcohol use: Yes    Alcohol/week: 6.0 - 8.0 standard drinks of alcohol    Types: 3 - 4 Cans of beer, 3 - 4 Shots of liquor per week   Drug use: Not Currently    Types: Marijuana    Comment: used in the past     Allergies   Patient has no known allergies.   Review of Systems Review of Systems  Constitutional:  Negative for fatigue and fever.  Musculoskeletal:  Positive for arthralgias and myalgias.  Neurological:  Negative for weakness, numbness and headaches.     Physical Exam Triage Vital Signs ED Triage Vitals  Enc Vitals Group     BP 10/24/21 1913 125/87     Pulse Rate 10/24/21 1913 80     Resp 10/24/21 1913 16     Temp 10/24/21 1913 98.6 F (37 C)     Temp Source 10/24/21 1913 Oral     SpO2 10/24/21 1913 100 %     Weight --      Height --      Head Circumference --      Peak Flow --      Pain Score 10/24/21 1910 10     Pain Loc --      Pain Edu? --      Excl. in [place]? --    No data found.  Updated Vital Signs BP 125/87 (BP Location: Right Arm)   Pulse 80   Temp 98.6 F (37 C) (Oral)   Resp 16   SpO2 100%   Visual Acuity Right Eye Distance:   Left Eye Distance:   Bilateral Distance:    Right Eye Near:   Left Eye Near:    Bilateral Near:     Physical Exam Vitals reviewed.  Constitutional:      Appearance: Normal appearance. She is normal weight.  HENT:     Head: Normocephalic.     Mouth/Throat:     Mouth: Mucous membranes are moist.  Cardiovascular:     Rate and Rhythm: Normal rate.  Pulmonary:     Effort: Pulmonary effort is normal.  Musculoskeletal:        General: No  swelling, tenderness, deformity or signs of injury. Normal range of motion.  Cervical back: Normal range of motion and neck supple.  Skin:    General: Skin is warm and dry.  Neurological:     General: No focal deficit present.     Mental Status: She is alert and oriented to person, place, and time.      UC Treatments / Results  Labs (all labs ordered are listed, but only abnormal results are displayed) Labs Reviewed - No data to display  EKG   Radiology No results found.  Procedures Procedures (including critical care time)  Medications Ordered in UC Medications  dexamethasone (DECADRON) injection 10 mg (10 mg Intramuscular Given 10/24/21 2053)    Initial Impression / Assessment and Plan / UC Course  I have reviewed the triage vital signs and the nursing notes.  Pertinent labs & imaging results that were available during my care of the patient were reviewed by me and considered in my medical decision making (see chart for details).    61 year old female presenting with polyarthralgias for the past several weeks.  Pain is located in multiple joints.  Patient was seen here on 10/16/2021 for the same.  She was initially placed on prednisone 40 mg daily for 5 days but the patient decreased his dose down to 10 mg daily due to unpleasant side effects.  Patient saw her holistic doctor earlier today and had blood work to evaluate for possible RA.  Patient reports a family history of this.  Patient reports that symptoms are the same and has not worsened nor has gotten any better.  Patient is afebrile and nontoxic.  Advised patient to stop prednisone.  Decadron 10 mg IM given in the clinic.  600 mg Motrin prescribed every 6 hours as needed.  Patient also advised that she can take Tylenol if this.  Apply heat to affected joints.  Follow-up with her holistic provider and information to rheumatology was given.  Today's evaluation has revealed no signs of a dangerous process. Discussed  diagnosis with patient and/or guardian. Patient and/or guardian aware of their diagnosis, possible red flag symptoms to watch out for and need for close follow up. Patient and/or guardian understands verbal and written discharge instructions. Patient and/or guardian comfortable with plan and disposition.  Patient and/or guardian has a clear mental status at this time, good insight into illness (after discussion and teaching) and has clear judgment to make decisions regarding their care  Documentation was completed with the aid of voice recognition software. Transcription may contain typographical errors. Final Clinical Impressions(s) / UC Diagnoses   Final diagnoses:  Polyarthralgia     Discharge Instructions      Stop prednisone  You may take over-the-counter tylenol, 2-extra strength (500 mg tablets) for a total of 1000 mg every 6 hours as needed. Do not take more than 4000 mg of tylenol in a 24-hour period.  Apply heat to affected areas  Follow-up with your holistic provider, primary care provider and rheumatology as needed        ED Prescriptions     Medication Sig Dispense Auth. Provider   ibuprofen (ADVIL) 600 MG tablet Take 1 tablet (600 mg total) by mouth every 6 (six) hours as needed for moderate pain or mild pain (take with food). 30 tablet Enrique Sack, FNP      PDMP not reviewed this encounter.   Enrique Sack, [place] 10/24/21 2059

## 2021-10-24 NOTE — Discharge Instructions (Addendum)
Stop prednisone  You may take over-the-counter tylenol, 2-extra strength (500 mg tablets) for a total of 1000 mg every 6 hours as needed. Do not take more than 4000 mg of tylenol in a 24-hour period.  Apply heat to affected areas  Follow-up with your holistic provider, primary care provider and rheumatology as needed

## 2021-10-31 ENCOUNTER — Encounter: Payer: Self-pay | Admitting: Family Medicine

## 2021-10-31 ENCOUNTER — Ambulatory Visit: Payer: BC Managed Care – PPO | Admitting: Family Medicine

## 2021-10-31 VITALS — BP 130/82 | HR 84 | Temp 98.5°F | Ht 62.75 in | Wt 181.2 lb

## 2021-10-31 DIAGNOSIS — M791 Myalgia, unspecified site: Secondary | ICD-10-CM

## 2021-10-31 DIAGNOSIS — M2550 Pain in unspecified joint: Secondary | ICD-10-CM

## 2021-10-31 NOTE — Progress Notes (Signed)
Subjective:     Patient ID: Jeanne Porter, female    DOB: 09/08/1960, 61 y.o.   MRN: [id number]  Chief Complaint  Patient presents with   Follow-up    Follow-up for UC visit on 10/20/2021    Pain    Joint pain, muscle stiffness, hips, legs, shoulders, has been taking Advil Discuss Prednisone    HPI  Just not "feel good".  Hurting all over. Pain-hard to get out of bed.  Hurt to lift arms/legs/couldn't walk/lift arms.  Started around mid Sept.  Sees Chiro. Worked really hard over summer.  Did have mild fever  went to uc.  Pred 41m-not help.  Then got shot of steroids   also dog bite-placed on augmentin.   All helped some  then washed car and worse again.  Hot tub helps some.  If does any activity, increases pain.  Holostic doc checked ANA titer. Told test was 1:40.   No obvious swelling.  Dad has PMR. +joint stiffness.   Today pain is 5.  Calves tight.  Pain can rad up buttock on L.  Hard to lift legs "hip flexors tight".   Upper arms feel like worked out long time.   Pred made hyper(manic?).   Pt not sure if "body just worn out" or other. Partner got covid-pt tested twice and neg and no symptoms   Health Maintenance Due  Topic Date Due   COLONOSCOPY (Pts 45-469yrInsurance coverage will need to be confirmed)  Never done   PAP SMEAR-Modifier  02/05/2015    Past Medical History:  Diagnosis Date   Cataract    Elevated glucose    Exertional headache    had ed eval and had small  bleed ;felt to be able to follow  and recheck as needed per neuro    Fatigue    Gallstones    reported "cleansing " and passes many stones and no more sx currrently    GERD (gastroesophageal reflux disease)    Hx of right knee surgery    acl   Hx of varicella    Hyperlipidemia    ICB (intracranial bleed) (HCC)    h/o-mild   Seasonal allergies     Past Surgical History:  Procedure Laterality Date   ANTERIOR CRUCIATE LIGAMENT REPAIR  01/02/1993   right knee   EYE SURGERY Left 2015   torn retina     Outpatient Medications Prior to Visit  Medication Sig Dispense Refill   Ascorbic Acid (VITAMIN C PO) Take 1 tablet by mouth. Occ.     Berberine Chloride (BERBERINE HCI PO) Take 1 tablet by mouth daily.     Calcium Carb-Cholecalciferol (CALCIUM 1000 + D PO) Take 1 tablet by mouth daily.     DIGESTIVE ENZYMES PO Take 1 capsule by mouth daily.     Glucosamine HCl (GLUCOSAMINE PO) Take 1 tablet by mouth daily.     ibuprofen (ADVIL) 600 MG tablet Take 1 tablet (600 mg total) by mouth every 6 (six) hours as needed for moderate pain or mild pain (take with food). 30 tablet 0   Multiple Vitamins-Minerals (MULTIVITAMIN WOMEN 50+ PO) Take 1 tablet by mouth daily.     Povidone-Iodine (IODINE SOLUTION EX) Apply topically. Once a week     Probiotic Product (PROBIOTIC PO) Take 1 tablet by mouth daily.     Triamcinolone Acetonide (NASACORT ALLERGY 24HR NA) Place into the nose. 1-2 times a week     amoxicillin-clavulanate (AUGMENTIN) 875-125 MG tablet Take 1  tablet by mouth every 12 (twelve) hours. (Patient not taking: Reported on 10/31/2021) 14 tablet 0   No facility-administered medications prior to visit.    No Known Allergies ROS neg/noncontributory except as noted HPI/below      Objective:     BP 130/82   Pulse 84   Temp 98.5 F (36.9 C) (Temporal)   Ht 5' 2.75" (1.594 m)   Wt 181 lb 4 oz (82.2 kg)   SpO2 99%   BMI 32.36 kg/m  Wt Readings from Last 3 Encounters:  10/31/21 181 lb 4 oz (82.2 kg)  09/23/21 181 lb 4 oz (82.2 kg)  03/25/12 182 lb (82.6 kg)    Physical Exam   Gen: WDWN NAD HEENT: NCAT, conjunctiva not injected, sclera nonicteric EXT:  no edema MSK: no gross abnormalities. Ms 4/5 BUE but poss more d/t pain.  5/5 BLE.  No ttp spine, arms/legs.  No obvious swelling. NEURO: A&O x3.  CN II-XII intact.  PSYCH: normal mood. Good eye contact     Assessment & Plan:   Problem List Items Addressed This Visit   None Visit Diagnoses     Myalgia    -  Primary    Relevant Orders   C-reactive protein   Sedimentation rate   CK   Rheumatoid factor   ANA   CBC with Differential/Platelet   Polyarthralgia       Relevant Orders   C-reactive protein   Sedimentation rate   CK   Rheumatoid factor   ANA     1.  Arthralgias/myalgias-not sure if overuse, autoimmune, PMR, other.  Patient intolerant of prednisone.  She does not like taking a lot of medications.  For now, take ibuprofen 600 mg 3 times daily and Tylenol 500 to 1000 mg 3 times daily as needed.  Also take turmeric.  Will check CRP, ESR, CK, rheumatoid factor, ANA, CBC with differential.  Patient will be going out of town for several weeks.  Advised that we will be in touch with lab results, and she can let us know if other symptoms show up.  Consider physical therapy or referral to sports medicine when he returns.  No orders of the defined types were placed in this encounter.   Wellington Hampshire, MD

## 2021-11-01 LAB — CBC WITH DIFFERENTIAL/PLATELET
Basophils Absolute: 0 10*3/uL (ref 0.0–0.1)
Basophils Relative: 0.4 % (ref 0.0–3.0)
Eosinophils Absolute: 0.2 10*3/uL (ref 0.0–0.7)
Eosinophils Relative: 2.4 % (ref 0.0–5.0)
HCT: 37.3 % (ref 36.0–46.0)
Hemoglobin: 12.3 g/dL (ref 12.0–15.0)
Lymphocytes Relative: 13.3 % (ref 12.0–46.0)
Lymphs Abs: 1.3 10*3/uL (ref 0.7–4.0)
MCHC: 33 g/dL (ref 30.0–36.0)
MCV: 90.5 fl (ref 78.0–100.0)
Monocytes Absolute: 0.9 10*3/uL (ref 0.1–1.0)
Monocytes Relative: 9.5 % (ref 3.0–12.0)
Neutro Abs: 7.2 10*3/uL (ref 1.4–7.7)
Neutrophils Relative %: 74.4 % (ref 43.0–77.0)
Platelets: 265 10*3/uL (ref 150.0–400.0)
RBC: 4.12 Mil/uL (ref 3.87–5.11)
RDW: 12.1 % (ref 11.5–15.5)
WBC: 9.7 10*3/uL (ref 4.0–10.5)

## 2021-11-01 LAB — SEDIMENTATION RATE: Sed Rate: 84 mm/hr — ABNORMAL HIGH (ref 0–30)

## 2021-11-01 LAB — CK: Total CK: 38 U/L (ref 7–177)

## 2021-11-01 LAB — C-REACTIVE PROTEIN: CRP: 6.6 mg/dL (ref 0.5–20.0)

## 2021-11-02 LAB — ANA: Anti Nuclear Antibody (ANA): POSITIVE — AB

## 2021-11-02 LAB — ANTI-NUCLEAR AB-TITER (ANA TITER): ANA Titer 1: 1:40 {titer} — ABNORMAL HIGH

## 2021-11-02 LAB — RHEUMATOID FACTOR: Rheumatoid fact SerPl-aCnc: <14 IU/mL (ref <14)

## 2021-11-03 NOTE — Progress Notes (Signed)
Spoke to pt on 11/2 at 8pm.   She is still "hurting" and feeling weak and hard to lift arms.  Can't tolerate high dose prednisone so taking advil and tylenol.  This am, took 5mg  pred and felt better and no adverse effects.    Discussed labs  RF neg, wbc normal.  ANA sl + which may or may not be significant  what is significant is the sed rate of 84.   Working diagnosis right now is PMR.  Since intol high dose pred, will do 12mg  daily of prednisolone for 1 wk and call w/progress or sooner if issues.  Will prob need to stay on longer term.  Pt currently in [place] for 2 wks.  She will call in am w/pharmacy.  Will send prednisolone eqivilent of 12mg  daily. Disp 2 wks worth.     For tonight, can try 5mg  more of pred and in am if tolerating.  Stop advil.  Continue tylenol.   Will need appt when back from [facility]     Please do referral to rheum for soon-prob PMR.

## 2021-11-04 ENCOUNTER — Other Ambulatory Visit: Payer: Self-pay | Admitting: *Deleted

## 2021-11-04 ENCOUNTER — Telehealth: Payer: Self-pay | Admitting: Family Medicine

## 2021-11-04 DIAGNOSIS — M353 Polymyalgia rheumatica: Secondary | ICD-10-CM

## 2021-11-04 MED ORDER — PREDNISOLONE 5 MG PO TABS: 15.0000 mg | ORAL_TABLET | Freq: Every day | ORAL | 0 refills | Status: DC

## 2021-11-04 NOTE — Telephone Encounter (Signed)
Pt States: -[facility] are [facility] supply. -She will call her insurance to find/verify where she can get a prescription filled [facility] a [facility].  Awaiting information from clinical lead before calling patient back about other options going forward.

## 2021-11-04 NOTE — Telephone Encounter (Signed)
Patient found supply and requests the following:  Patient requests new RX for prednisoLONE 5 MG TABS tablet   Be sent asap to [facility] located at [address] [place] [address] PH# [phone]  Patient requests to be called at ph# [phone] once RX has been sent to Pharmacy

## 2021-11-04 NOTE — Telephone Encounter (Signed)
Patient states: -[facility]  until next [facility].  -Only was she would be able to get med today is if [facility] today.  -She is leaving town [place] - She was also informed that the cost of medication would be $1000. She will be calling her insurance company to see how much would be covered.

## 2021-11-04 NOTE — Telephone Encounter (Signed)
Left voicemail on mobile phone, per DPR.   After patient receives feedback from insurance company about optional pharmacies within the network, pt should call around to find suppy.  Once supply is found, patient should request that pharmacy to [facility] immediately to have the prescription transferred, so it can be filled.  If supply is not found, the last option is to go to an Emergency Department or Urgent Care.

## 2021-11-04 NOTE — Telephone Encounter (Signed)
Patient states: -She spoke to PCP last night around 8pm and was informed to let front office know pharmacy info so she can send in a medication - Pt is unable to remember which medication was meant to be sent in   [place] [address] Phone: [phone]

## 2021-11-04 NOTE — Addendum Note (Signed)
Addended by: Zacarias Pontes on: 11/04/2021 01:27 PM   Modules accepted: Orders

## 2021-11-04 NOTE — Telephone Encounter (Signed)
Rx sent to the pharmacy.

## 2021-11-04 NOTE — Telephone Encounter (Signed)
[facility] sent to the [facility]. Patient aware.

## 2021-11-10 ENCOUNTER — Telehealth: Payer: Self-pay | Admitting: Family Medicine

## 2021-11-10 NOTE — Telephone Encounter (Signed)
Patient notified and verbalized understanding. Patient has appointment scheduled on 11/21/21. Weakness is getting somewhat better.

## 2021-11-10 NOTE — Telephone Encounter (Signed)
Please see message below and advise.

## 2021-11-10 NOTE — Telephone Encounter (Signed)
Patient states she is still in pain but is managing with her new medication. Patient states the new medication does make her a little more sleepy.  Patient states her pain level has stayed between a 4-7.   Patient states she did get a call from the Rheumatologist she was Referred to -appointment was scheduled for May 12, 2022.

## 2021-11-21 ENCOUNTER — Encounter: Payer: Self-pay | Admitting: Family Medicine

## 2021-11-21 ENCOUNTER — Ambulatory Visit: Payer: BC Managed Care – PPO | Admitting: Family Medicine

## 2021-11-21 VITALS — BP 130/88 | HR 83 | Temp 98.4°F | Ht 62.75 in

## 2021-11-21 DIAGNOSIS — M353 Polymyalgia rheumatica: Secondary | ICD-10-CM | POA: Diagnosis not present

## 2021-11-21 DIAGNOSIS — R61 Generalized hyperhidrosis: Secondary | ICD-10-CM

## 2021-11-21 LAB — SEDIMENTATION RATE: Sed Rate: 57 mm/hr — ABNORMAL HIGH (ref 0–30)

## 2021-11-21 LAB — CBC WITH DIFFERENTIAL/PLATELET
Basophils Absolute: 0 10*3/uL (ref 0.0–0.1)
Basophils Relative: 0.4 % (ref 0.0–3.0)
Eosinophils Absolute: 0 10*3/uL (ref 0.0–0.7)
Eosinophils Relative: 0.4 % (ref 0.0–5.0)
HCT: 40.9 % (ref 36.0–46.0)
Hemoglobin: 13.4 g/dL (ref 12.0–15.0)
Lymphocytes Relative: 10.6 % — ABNORMAL LOW (ref 12.0–46.0)
Lymphs Abs: 1.4 10*3/uL (ref 0.7–4.0)
MCHC: 32.8 g/dL (ref 30.0–36.0)
MCV: 91.6 fl (ref 78.0–100.0)
Monocytes Absolute: 1.2 10*3/uL — ABNORMAL HIGH (ref 0.1–1.0)
Monocytes Relative: 8.6 % (ref 3.0–12.0)
Neutro Abs: 10.8 10*3/uL — ABNORMAL HIGH (ref 1.4–7.7)
Neutrophils Relative %: 80 % — ABNORMAL HIGH (ref 43.0–77.0)
Platelets: 300 10*3/uL (ref 150.0–400.0)
RBC: 4.46 Mil/uL (ref 3.87–5.11)
RDW: 13.4 % (ref 11.5–15.5)
WBC: 13.5 10*3/uL — ABNORMAL HIGH (ref 4.0–10.5)

## 2021-11-21 LAB — C-REACTIVE PROTEIN: CRP: 1.1 mg/dL (ref 0.5–20.0)

## 2021-11-21 MED ORDER — PREDNISOLONE 5 MG PO TABS: 15.0000 mg | ORAL_TABLET | Freq: Every day | ORAL | 0 refills | Status: DC

## 2021-11-21 NOTE — Patient Instructions (Addendum)
It was very nice to see you today!  Anti-inflammatory diet-decreased sugar and starches.  Keep in touch how feeling.  Continue the steroids.   Can do tumeric pills.  Happy Holidays   PLEASE NOTE:  If you had any lab tests please let us know if you have not heard back within a few days. You may see your results on MyChart before we have a chance to review them but we will give you a call once they are reviewed by [place]. If we ordered any referrals today, please let us know if you have not heard from their office within the next week.   Please try these tips to maintain a healthy lifestyle:  Eat most of your calories during the day when you are active. Eliminate processed foods including packaged sweets (pies, cakes, cookies), reduce intake of potatoes, white bread, white pasta, and white rice. Look for whole grain options, oat flour or almond flour.  Each meal should contain half fruits/vegetables, one quarter protein, and one quarter carbs (no bigger than a computer mouse).  Cut down on sweet beverages. This includes juice, soda, and sweet tea. Also watch fruit intake, though this is a healthier sweet option, it still contains natural sugar! Limit to 3 servings daily.  Drink at least 1 glass of water with each meal and aim for at least 8 glasses per day  Exercise at least 150 minutes every week.

## 2021-11-21 NOTE — Progress Notes (Signed)
Subjective:     Patient ID: Jeanne Porter, female    DOB: October 11, 1960, 61 y.o.   MRN: [id number]  Chief Complaint  Patient presents with   Follow-up    Follow-up[ on arm pain and recent lab results     HPI Arm pain/weakness-didn't get steroids for several days-didn't have in stock and then pt kept traveling. Was taking prednisone 48m/day(left over) BID. Finally got prednisolone 1 wk later and taking now.  No SE.  Taking 1 tab tid.  Occ misses.  If misses one dose, keeps coming back.  Strength overall less than in past.  Needed help unloading truck/tools.pain ok.  Overused r arm so "worthless today".    Tylenol 5077mday.    Occ sweats. Occ not "feel good".  ?fever at times.   Mad at self for not taking care of self for long time.   Health Maintenance Due  Topic Date Due   COLONOSCOPY (Pts [id number] coverage will need to be confirmed)  Never done   PAP SMEAR-Modifier  02/05/2015    Past Medical History:  Diagnosis Date   Cataract    Elevated glucose    Exertional headache    had ed eval and had small  bleed ;felt to be able to follow  and recheck as needed per neuro    Fatigue    Gallstones    reported "cleansing " and passes many stones and no more sx currrently    GERD (gastroesophageal reflux disease)    Hx of right knee surgery    acl   Hx of varicella    Hyperlipidemia    ICB (intracranial bleed) (HCC)    h/o-mild   Seasonal allergies     Past Surgical History:  Procedure Laterality Date   ANTERIOR CRUCIATE LIGAMENT REPAIR  01/02/1993   right knee   EYE SURGERY Left 2015   torn retina    Outpatient Medications Prior to Visit  Medication Sig Dispense Refill   Ascorbic Acid (VITAMIN C PO) Take 1 tablet by mouth. Occ.     Berberine Chloride (BERBERINE HCI PO) Take 1 tablet by mouth daily.     Calcium Carb-Cholecalciferol (CALCIUM 1000 + D PO) Take 1 tablet by mouth daily.     DIGESTIVE ENZYMES PO Take 1 capsule by mouth daily.     Glucosamine HCl  (GLUCOSAMINE PO) Take 1 tablet by mouth daily.     ibuprofen (ADVIL) 600 MG tablet Take 1 tablet (600 mg total) by mouth every 6 (six) hours as needed for moderate pain or mild pain (take with food). 30 tablet 0   Multiple Vitamins-Minerals (MULTIVITAMIN WOMEN 50+ PO) Take 1 tablet by mouth daily.     Povidone-Iodine (IODINE SOLUTION EX) Apply topically. Once a week     prednisoLONE 5 MG TABS tablet Take 3 tablets (15 mg total) by mouth daily. 42 tablet 0   Probiotic Product (PROBIOTIC PO) Take 1 tablet by mouth daily.     Triamcinolone Acetonide (NASACORT ALLERGY 24HR NA) Place into the nose. 1-2 times a week     No facility-administered medications prior to visit.    No Known Allergies ROS neg/noncontributory except as noted HPI/below      Objective:     BP 130/88 (BP Location: Left Arm, Patient Position: Sitting, Cuff Size: Large)   Pulse 83   Temp 98.4 F (36.9 C) (Temporal)   Ht 5' 2.75" (1.594 m)   SpO2 99%   BMI 32.36 kg/m  Wt Readings from  Last 3 Encounters:  10/31/21 181 lb 4 oz (82.2 kg)  09/23/21 181 lb 4 oz (82.2 kg)  03/25/12 182 lb (82.6 kg)    Physical Exam   Gen: WDWN NAD HEENT: NCAT, conjunctiva not injected, sclera nonicteric NECK:  supple, no thyromegaly, no nodes, no carotid bruits CARDIAC: RRR, S1S2+, no murmur. DP 2+B EXT:  no edema MSK: no gross abnormalities.  Ms 5/5 NEURO: A&O x3.  CN II-XII intact.  PSYCH: normal mood. Good eye contact.  Occ tearful     Assessment & Plan:   Problem List Items Addressed This Visit   None Visit Diagnoses     PMR (polymyalgia rheumatica) ([place])    -  Primary      PMR-most likely dx.  Getting better on pred.  Discussed need and reasoning for taking meds and not missing doses. Will check cbc,esr,crp, lyme.  Renewd prednisolone 72m daily(pt SE to prednisone).  F/u 3 mo.  Sweats-?from PMR, hypoglycemia, other.  Check CT abd for malignancy.   No orders of the defined types were placed in this  encounter.   AWellington Hampshire MD

## 2021-11-22 ENCOUNTER — Telehealth: Payer: Self-pay | Admitting: Family Medicine

## 2021-11-22 ENCOUNTER — Other Ambulatory Visit: Payer: Self-pay | Admitting: Family Medicine

## 2021-11-22 ENCOUNTER — Encounter: Payer: Self-pay | Admitting: Family Medicine

## 2021-11-22 LAB — LYME DISEASE SEROLOGY W/REFLEX: Lyme Total Antibody EIA: NEGATIVE

## 2021-11-22 MED ORDER — PREDNISONE 5 MG PO TABS: 15.0000 mg | ORAL_TABLET | Freq: Every day | ORAL | 1 refills | Status: DC

## 2021-11-22 NOTE — Telephone Encounter (Signed)
Patient reached our via Mychart message from appointment request.   Patient states: -She was unable to message Dr. [name] directly about still not being able to get her medication - PCP informed her that if she can't get medication then PCP would send in regular prednisone.  - She can't miss a dose today--Needs medication ASAP   Patient requests: -PCP send regular prednisone to [facility] on [address]

## 2021-11-22 NOTE — Telephone Encounter (Signed)
Rx was sent to per pharmacy yesterday.

## 2021-11-22 NOTE — Telephone Encounter (Signed)
Patient states Pharmacy does not have 5 mg Prednisolone-states she is having trouble getting it.  Patient states she is out of the medication listed above and requests  at least a 14 day RX for Prednisone as close to 5 Mg as possible be sent to  [facility] [id number] [place], [place] - [address] Phone: [phone]  Fax: [phone]

## 2021-11-30 ENCOUNTER — Encounter: Payer: Self-pay | Admitting: Family Medicine

## 2021-12-05 ENCOUNTER — Encounter: Payer: Self-pay | Admitting: Family Medicine

## 2021-12-07 ENCOUNTER — Other Ambulatory Visit: Payer: Self-pay | Admitting: *Deleted

## 2021-12-07 MED ORDER — VALACYCLOVIR HCL 1 G PO TABS: 1000.0000 mg | ORAL_TABLET | Freq: Three times a day (TID) | ORAL | 0 refills | Status: AC

## 2022-01-03 ENCOUNTER — Encounter: Payer: Self-pay | Admitting: Family Medicine

## 2022-01-03 NOTE — Addendum Note (Signed)
Addended by: Zacarias Pontes on: 01/03/2022 04:17 PM   Modules accepted: Orders

## 2022-01-04 ENCOUNTER — Ambulatory Visit: Payer: BC Managed Care – PPO | Admitting: Family Medicine

## 2022-01-04 ENCOUNTER — Encounter: Payer: Self-pay | Admitting: Family Medicine

## 2022-01-04 VITALS — BP 130/90 | HR 80 | Temp 97.6°F | Ht 62.75 in | Wt 196.0 lb

## 2022-01-04 DIAGNOSIS — R1084 Generalized abdominal pain: Secondary | ICD-10-CM | POA: Diagnosis not present

## 2022-01-04 DIAGNOSIS — M353 Polymyalgia rheumatica: Secondary | ICD-10-CM

## 2022-01-04 DIAGNOSIS — R319 Hematuria, unspecified: Secondary | ICD-10-CM

## 2022-01-04 DIAGNOSIS — R7303 Prediabetes: Secondary | ICD-10-CM | POA: Diagnosis not present

## 2022-01-04 DIAGNOSIS — M2550 Pain in unspecified joint: Secondary | ICD-10-CM

## 2022-01-04 LAB — POCT URINALYSIS DIPSTICK
Bilirubin, UA: NEGATIVE
Blood, UA: POSITIVE
Glucose, UA: NEGATIVE
Ketones, UA: NEGATIVE
Nitrite, UA: NEGATIVE
Protein, UA: NEGATIVE
Spec Grav, UA: 1.02 (ref 1.010–1.025)
Urobilinogen, UA: 0.2 E.U./dL
pH, UA: 5.5 (ref 5.0–8.0)

## 2022-01-04 LAB — CK: Total CK: 43 U/L (ref 7–177)

## 2022-01-04 LAB — CBC WITH DIFFERENTIAL/PLATELET
Basophils Absolute: 0.1 10*3/uL (ref 0.0–0.1)
Basophils Relative: 1.1 % (ref 0.0–3.0)
Eosinophils Absolute: 0.1 10*3/uL (ref 0.0–0.7)
Eosinophils Relative: 0.9 % (ref 0.0–5.0)
HCT: 44.6 % (ref 36.0–46.0)
Hemoglobin: 14.8 g/dL (ref 12.0–15.0)
Lymphocytes Relative: 20.2 % (ref 12.0–46.0)
Lymphs Abs: 1.8 10*3/uL (ref 0.7–4.0)
MCHC: 33.1 g/dL (ref 30.0–36.0)
MCV: 93.8 fl (ref 78.0–100.0)
Monocytes Absolute: 0.5 10*3/uL (ref 0.1–1.0)
Monocytes Relative: 6.2 % (ref 3.0–12.0)
Neutro Abs: 6.3 10*3/uL (ref 1.4–7.7)
Neutrophils Relative %: 71.6 % (ref 43.0–77.0)
Platelets: 264 10*3/uL (ref 150.0–400.0)
RBC: 4.76 Mil/uL (ref 3.87–5.11)
RDW: 15.6 % — ABNORMAL HIGH (ref 11.5–15.5)
WBC: 8.8 10*3/uL (ref 4.0–10.5)

## 2022-01-04 LAB — COMPREHENSIVE METABOLIC PANEL
ALT: 22 U/L (ref 0–35)
AST: 15 U/L (ref 0–37)
Albumin: 4.2 g/dL (ref 3.5–5.2)
Alkaline Phosphatase: 64 U/L (ref 39–117)
BUN: 18 mg/dL (ref 6–23)
CO2: 28 mEq/L (ref 19–32)
Calcium: 9.4 mg/dL (ref 8.4–10.5)
Chloride: 102 mEq/L (ref 96–112)
Creatinine, Ser: 0.76 mg/dL (ref 0.40–1.20)
GFR: 84.97 mL/min (ref >60.00)
Glucose, Bld: 104 mg/dL — ABNORMAL HIGH (ref 70–99)
Potassium: 5 mEq/L (ref 3.5–5.1)
Sodium: 140 mEq/L (ref 135–145)
Total Bilirubin: 0.4 mg/dL (ref 0.2–1.2)
Total Protein: 7 g/dL (ref 6.0–8.3)

## 2022-01-04 LAB — SEDIMENTATION RATE: Sed Rate: 58 mm/hr — ABNORMAL HIGH (ref 0–30)

## 2022-01-04 LAB — TSH: TSH: 1.44 u[IU]/mL (ref 0.35–5.50)

## 2022-01-04 LAB — MAGNESIUM: Magnesium: 2.2 mg/dL (ref 1.5–2.5)

## 2022-01-04 LAB — C-REACTIVE PROTEIN: CRP: 1.2 mg/dL (ref 0.5–20.0)

## 2022-01-04 LAB — LIPASE: Lipase: 40 U/L (ref 11.0–59.0)

## 2022-01-04 LAB — HEMOGLOBIN A1C: Hgb A1c MFr Bld: 6.1 % (ref 4.6–6.5)

## 2022-01-04 MED ORDER — AMOXICILLIN-POT CLAVULANATE 875-125 MG PO TABS: 1.0000 | ORAL_TABLET | Freq: Two times a day (BID) | ORAL | 0 refills | Status: DC

## 2022-01-04 NOTE — Telephone Encounter (Signed)
Discussed at appointment on 01/04/22.

## 2022-01-04 NOTE — Progress Notes (Signed)
Subjective:     Patient ID: Jeanne Porter, female    DOB: [birth year], 61 y.o.   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  Has been on abx and valtrex.  Concerned as myalgias/arthralgias.  Functional med doc said not autoimmune and told blood infection.  Symptoms:as long as takes pred, ok, but gaining wt and moody.  If does too much, worsens again. Has tried to decrease meds.  Taking 1 in am, 1/2 in afternoon and 1 at HS.  Hands stiff and hard to grip at times.  Weakness better if takes meds.  No HA, no visual loss.  Some blurry vision.   Teeth hurt.   Intermitt abd pain.no dysuria.  Some DOE uphill. No n/v/d/c.  No vag bleeding. Fever if no pred-up to 101.  Never colon. Cologard neg.  Mamm coming up.  Pap coming up.    Health Maintenance Due  Topic Date Due   COLONOSCOPY (Pts 45-474yrInsurance coverage will need to be confirmed)  Never done   MAMMOGRAM  02/04/2014   PAP SMEAR-Modifier  02/05/2015    Past Medical History:  Diagnosis Date   Cataract    Elevated glucose    Exertional headache    had ed eval and had small  bleed ;felt to be able to follow  and recheck as needed per neuro    Fatigue    Gallstones    reported "cleansing " and passes many stones and no more sx currrently    GERD (gastroesophageal reflux disease)    Hx of right knee surgery    acl   Hx of varicella    Hyperlipidemia    [place]   h/o-mild   Seasonal allergies     Past Surgical History:  Procedure Laterality Date   ANTERIOR CRUCIATE LIGAMENT REPAIR  01/02/1993   right knee   EYE SURGERY Left 2015   torn retina    Outpatient Medications Prior to Visit  Medication Sig Dispense Refill   Ascorbic Acid (VITAMIN C PO) Take 1 tablet by mouth. Occ.      Berberine Chloride (BERBERINE HCI PO) Take 1 tablet by mouth daily.     Calcium Carb-Cholecalciferol (CALCIUM 1000 + D PO) Take 1 tablet by mouth daily.     DIGESTIVE ENZYMES PO Take 1 capsule by mouth daily.     Glucosamine HCl (GLUCOSAMINE PO) Take 1 tablet by mouth daily.     ibuprofen (ADVIL) 600 MG tablet Take 1 tablet (600 mg total) by mouth every 6 (six) hours as needed for moderate pain or mild pain (take with food). 30 tablet 0   Multiple Vitamins-Minerals (MULTIVITAMIN WOMEN 50+ PO) Take 1 tablet by mouth daily.     Povidone-Iodine (IODINE SOLUTION EX) Apply topically. Once a week     predniSONE (DELTASONE) 5 MG tablet Take 3 tablets (15 mg total) by mouth daily with breakfast. 45 tablet 1   Probiotic Product (PROBIOTIC PO) Take 1 tablet by mouth daily.     Triamcinolone Acetonide (NASACORT ALLERGY 24HR NA) Place into the nose. 1-2 times a week     prednisoLONE 5 MG TABS tablet Take 3 tablets (15 mg total) by mouth daily. (Patient not taking: Reported on 01/04/2022) 270 tablet 0  No facility-administered medications prior to visit.    No Known Allergies ROS neg/noncontributory except as noted HPI/below      Objective:     BP (!) 130/90   Pulse 80   Temp 97.6 F (36.4 C) (Temporal)   Ht 5' 2.75" (1.594 m)   Wt 196 lb (88.9 kg)   SpO2 99%   BMI 35.00 kg/m  Wt Readings from Last 3 Encounters:  01/04/22 196 lb (88.9 kg)  10/31/21 181 lb 4 oz (82.2 kg)  09/23/21 181 lb 4 oz (82.2 kg)    Physical Exam   Gen: WDWN NAD HEENT: NCAT, conjunctiva not injected, sclera nonicteric NECK:  supple, no thyromegaly, no nodes, no carotid bruits CARDIAC: RRR, S1S2+, no murmur. DP 2+B LUNGS: CTAB. No wheezes ABDOMEN:  BS+, soft, diffuse, mild tenderness esp mid epi, No HSM, no masses EXT:  no edema MSK: no gross abnormalities.  NEURO: A&O x3.  CN II-XII intact.  PSYCH: normal mood. Good eye contact    Results for orders placed or performed in visit on 01/04/22  POCT urinalysis  dipstick  Result Value Ref Range   Color, UA YELLOW    Clarity, UA CLEAR    Glucose, UA Negative Negative   Bilirubin, UA NEG    Ketones, UA NEG    Spec Grav, UA 1.020 1.010 - 1.025   Blood, UA POSITIVE    pH, UA 5.5 5.0 - 8.0   Protein, UA Negative Negative   Urobilinogen, UA 0.2 0.2 or 1.0 E.U./dL   Nitrite, UA NEG    Leukocytes, UA Small (1+) (A) Negative   Appearance     Odor         Assessment & Plan:   Problem List Items Addressed This Visit       Other   Prediabetes   Relevant Orders   Hemoglobin A1c   Other Visit Diagnoses     PMR (polymyalgia rheumatica) (HCC)    -  Primary   Polyarthralgia       Relevant Orders   Comprehensive metabolic panel   CBC with Differential/Platelet   C-reactive protein   Sedimentation rate   TSH   Magnesium   CK   Generalized abdominal pain       Relevant Orders   Comprehensive metabolic panel   CBC with Differential/Platelet   POCT urinalysis dipstick   Lipase   CT Abdomen Pelvis W Contrast      Abd pain-check ua, cbc,cmp,lipase, ct abd/pelvis  ?panc tumor, tics, malig, other.   PMR-cont pred.  Will send to another rheum  check esr,crp Myalgias/polyarth-check cbc,cmp,tsh,mg,ck, esr, crp.Marland Kitchen   augmentin 875 bid x 10d. To make sure not d/t dog bite bacteria(already tx'd once).   preDM-check A1c.  Pt on pred so may exacerbate  Meds ordered this encounter  Medications   amoxicillin-clavulanate (AUGMENTIN) 875-125 MG tablet    Sig: Take 1 tablet by mouth 2 (two) times daily.    Dispense:  20 tablet    Refill:  0    Wellington Hampshire, MD

## 2022-01-04 NOTE — Progress Notes (Signed)
Labs are stable.  Sed rate of course is elevated.  Await CT

## 2022-01-05 ENCOUNTER — Ambulatory Visit
Admission: RE | Admit: 2022-01-05 | Discharge: 2022-01-05 | Disposition: A | Discharge status: Home / Self Care | Payer: BC Managed Care – PPO | Source: Ambulatory Visit | Attending: Family Medicine | Admitting: Family Medicine

## 2022-01-05 ENCOUNTER — Other Ambulatory Visit: Payer: Self-pay | Admitting: *Deleted

## 2022-01-05 ENCOUNTER — Inpatient Hospital Stay: Admission: RE | Admit: 2022-01-05 | Payer: BC Managed Care – PPO | Source: Ambulatory Visit

## 2022-01-05 DIAGNOSIS — R1084 Generalized abdominal pain: Secondary | ICD-10-CM

## 2022-01-05 DIAGNOSIS — R319 Hematuria, unspecified: Secondary | ICD-10-CM

## 2022-01-05 MED ORDER — IOPAMIDOL (ISOVUE-300) INJECTION 61%
100.0000 mL | Freq: Once | INTRAVENOUS | Status: AC | PRN
  Administered 2022-01-05: 100 mL via INTRAVENOUS

## 2022-01-06 LAB — URINE CULTURE
MICRO NUMBER:: 14388450
Result:: NO GROWTH
SPECIMEN QUALITY:: ADEQUATE

## 2022-01-17 ENCOUNTER — Telehealth: Payer: Self-pay | Admitting: Family Medicine

## 2022-01-17 NOTE — Telephone Encounter (Signed)
ERROR

## 2022-01-23 ENCOUNTER — Encounter: Payer: Self-pay | Admitting: Family Medicine

## 2022-01-27 DIAGNOSIS — H02845 Edema of left lower eyelid: Secondary | ICD-10-CM | POA: Diagnosis not present

## 2022-01-27 DIAGNOSIS — H00024 Hordeolum internum left upper eyelid: Secondary | ICD-10-CM | POA: Diagnosis not present

## 2022-01-27 DIAGNOSIS — H02842 Edema of right lower eyelid: Secondary | ICD-10-CM | POA: Diagnosis not present

## 2022-02-06 DIAGNOSIS — R5383 Other fatigue: Secondary | ICD-10-CM | POA: Diagnosis not present

## 2022-02-06 DIAGNOSIS — R700 Elevated erythrocyte sedimentation rate: Secondary | ICD-10-CM | POA: Diagnosis not present

## 2022-02-06 DIAGNOSIS — M353 Polymyalgia rheumatica: Secondary | ICD-10-CM | POA: Diagnosis not present

## 2022-02-21 ENCOUNTER — Ambulatory Visit: Payer: BC Managed Care – PPO | Admitting: Family Medicine

## 2022-02-23 ENCOUNTER — Encounter: Payer: Self-pay | Admitting: Obstetrics and Gynecology

## 2022-02-23 ENCOUNTER — Ambulatory Visit (INDEPENDENT_AMBULATORY_CARE_PROVIDER_SITE_OTHER): Payer: BC Managed Care – PPO | Admitting: Obstetrics and Gynecology

## 2022-02-23 ENCOUNTER — Other Ambulatory Visit (HOSPITAL_COMMUNITY)
Admission: RE | Admit: 2022-02-23 | Discharge: 2022-02-23 | Disposition: A | Discharge status: Home / Self Care | Payer: BC Managed Care – PPO | Source: Ambulatory Visit | Attending: Obstetrics and Gynecology | Admitting: Obstetrics and Gynecology

## 2022-02-23 VITALS — BP 134/82 | HR 79 | Ht 62.6 in | Wt 198.0 lb

## 2022-02-23 DIAGNOSIS — E669 Obesity, unspecified: Secondary | ICD-10-CM | POA: Insufficient documentation

## 2022-02-23 DIAGNOSIS — Z124 Encounter for screening for malignant neoplasm of cervix: Secondary | ICD-10-CM | POA: Diagnosis not present

## 2022-02-23 DIAGNOSIS — Z01419 Encounter for gynecological examination (general) (routine) without abnormal findings: Secondary | ICD-10-CM | POA: Diagnosis not present

## 2022-02-23 NOTE — Patient Instructions (Signed)

## 2022-02-23 NOTE — Progress Notes (Signed)
62 y.o. No obstetric history on file. Married White or Caucasian Not Hispanic or Latino female here for annual exam.  No vaginal bleeding.  No bowel or bladder issues.    She is being evaluated for PMR (polymyalgia rheumatica). She has severe fatigue, loss of strength. She has gained 20 lbs on prednisone (since October). Feels terrible, mood is poor.    She has the HLA B27 gene.   No LMP recorded. Patient is postmenopausal.          Sexually active: Yes.   Female partner.  The current method of family planning is none and post menopausal status.  Female partner   Exercising: No.  The patient does not participate in regular exercise at present. Smoker:  no  Health Maintenance: Pap:  she thinks 2014  History of abnormal Pap:  no MMG:  02/05/12 normal, she will schedule it at [facility].   BMD:   She had a life line screening  Colonoscopy: never colagaurd 10/10/21 neg  TDaP:  05/27/14 Gardasil: n/a   reports that she has quit smoking. Her smoking use included cigarettes. She has been exposed to tobacco smoke. She has never used smokeless tobacco. She reports current alcohol use of about 6.0 - 8.0 standard drinks of alcohol per week. She reports that she does not currently use drugs after having used the following drugs: Marijuana. 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   Past Medical History:  Diagnosis Date   Cataract    Elevated glucose    Exertional headache    had ed eval and had small  bleed ;felt to be able to follow  and recheck as needed per neuro    Fatigue    Gallstones    reported "cleansing " and passes many stones and no more sx currrently    GERD (gastroesophageal reflux disease)    Hx of right knee surgery    acl   Hx of varicella    Hyperlipidemia    ICB (intracranial bleed) (HCC)    h/o-mild   Seasonal allergies     Past Surgical History:  Procedure Laterality Date   ANTERIOR CRUCIATE LIGAMENT REPAIR  01/02/1993   right knee   EYE SURGERY Left 2015    torn retina    Current Outpatient Medications  Medication Sig Dispense Refill   DIGESTIVE ENZYMES PO Take 1 capsule by mouth daily.     Multiple Vitamins-Minerals (MULTIVITAMIN WOMEN 50+ PO) Take 1 tablet by mouth daily.     prednisoLONE 5 MG TABS tablet Take 3 tablets (15 mg total) by mouth daily. 270 tablet 0   Probiotic Product (PROBIOTIC PO) Take 1 tablet by mouth daily.     Triamcinolone Acetonide (NASACORT ALLERGY 24HR NA) Place into the nose. 1-2 times a week     No current facility-administered medications for this visit.    Family History  Problem Relation Age of Onset   Hyperlipidemia Mother    Alcohol abuse Father    Breast cancer Sister    Hyperlipidemia Maternal Grandmother    Heart disease Maternal Grandmother    Diabetes Maternal Grandfather    Heart disease Paternal Grandmother    Diabetes Other    Heart disease Other    Heart failure Other    Heart attack Other     Review of Systems  All other systems reviewed and are negative.   Exam:   BP 134/82   Pulse 79   Ht 5' 2.6" (1.59 m)  Wt 198 lb (89.8 kg)   SpO2 100%   BMI 35.53 kg/m   Weight change: @WEIGHTCHANGE$ @ Height:   Height: 5' 2.6" (159 cm)  Ht Readings from Last 3 Encounters:  02/23/22 5' 2.6" (1.59 m)  01/04/22 5' 2.75" (1.594 m)  11/21/21 5' 2.75" (1.594 m)    General appearance: alert, cooperative and appears stated age Head: Normocephalic, without obvious abnormality, atraumatic Neck: no adenopathy, supple, symmetrical, trachea midline and thyroid normal to inspection and palpation Lungs: clear to auscultation bilaterally Cardiovascular: regular rate and rhythm Breasts: normal appearance, no masses or tenderness Abdomen: soft, non-tender; non distended,  no masses,  no organomegaly Extremities: extremities normal, atraumatic, no cyanosis or edema Skin: Skin color, texture, turgor normal. No rashes or lesions Lymph nodes: Cervical, supraclavicular, and axillary nodes normal. No  abnormal inguinal nodes palpated Neurologic: Grossly normal   Pelvic: External genitalia:  no lesions              Urethra:  normal appearing urethra with no masses, tenderness or lesions              Bartholins and Skenes: normal                 Vagina: normal appearing vagina with normal color and discharge, no lesions              Cervix: no lesions               Bimanual Exam:  Uterus:   no masses or tenderness              Adnexa: no mass, fullness, tenderness               Rectovaginal: Confirms               Anus:  normal sphincter tone, no lesions  Gae Dry, CMA chaperoned for the exam.  1. Well woman exam Discussed breast self exam Discussed calcium and vit D intake She will schedule her mammogram [facility]TD [facility] with primary  2. Screening for cervical cancer - Cytology - PAP

## 2022-02-24 LAB — CYTOLOGY - PAP
Comment: NEGATIVE
Diagnosis: NEGATIVE
High risk HPV: NEGATIVE

## 2022-03-02 ENCOUNTER — Encounter: Payer: Self-pay | Admitting: Family Medicine

## 2022-03-02 ENCOUNTER — Other Ambulatory Visit: Payer: Self-pay | Admitting: *Deleted

## 2022-03-02 DIAGNOSIS — M791 Myalgia, unspecified site: Secondary | ICD-10-CM

## 2022-03-02 DIAGNOSIS — M353 Polymyalgia rheumatica: Secondary | ICD-10-CM

## 2022-03-06 ENCOUNTER — Encounter: Payer: Self-pay | Admitting: Obstetrics and Gynecology

## 2022-03-06 DIAGNOSIS — E2839 Other primary ovarian failure: Secondary | ICD-10-CM

## 2022-03-08 DIAGNOSIS — M791 Myalgia, unspecified site: Secondary | ICD-10-CM | POA: Diagnosis not present

## 2022-03-08 DIAGNOSIS — M069 Rheumatoid arthritis, unspecified: Secondary | ICD-10-CM | POA: Diagnosis not present

## 2022-03-08 DIAGNOSIS — M1990 Unspecified osteoarthritis, unspecified site: Secondary | ICD-10-CM | POA: Diagnosis not present

## 2022-03-08 DIAGNOSIS — M353 Polymyalgia rheumatica: Secondary | ICD-10-CM | POA: Diagnosis not present

## 2022-03-10 NOTE — Telephone Encounter (Signed)
Please send a order for a DEXA to [facility], diagnosis hypoestrogen, and let the patient know. Thanks.

## 2022-03-10 NOTE — Addendum Note (Signed)
Addended by: Judy Pimple D on: 03/10/2022 04:42 PM   Modules accepted: Orders

## 2022-03-13 NOTE — Telephone Encounter (Signed)
DEXA order successfully faxed over to [facility] on 03/10/2022.

## 2022-03-14 ENCOUNTER — Encounter (INDEPENDENT_AMBULATORY_CARE_PROVIDER_SITE_OTHER): Payer: BC Managed Care – PPO | Admitting: Ophthalmology

## 2022-03-22 ENCOUNTER — Encounter: Payer: Self-pay | Admitting: Family Medicine

## 2022-03-22 DIAGNOSIS — Z803 Family history of malignant neoplasm of breast: Secondary | ICD-10-CM | POA: Diagnosis not present

## 2022-03-22 DIAGNOSIS — Z1231 Encounter for screening mammogram for malignant neoplasm of breast: Secondary | ICD-10-CM | POA: Diagnosis not present

## 2022-03-22 DIAGNOSIS — M8589 Other specified disorders of bone density and structure, multiple sites: Secondary | ICD-10-CM | POA: Diagnosis not present

## 2022-03-22 DIAGNOSIS — Z780 Asymptomatic menopausal state: Secondary | ICD-10-CM | POA: Diagnosis not present

## 2022-03-23 ENCOUNTER — Encounter: Payer: Self-pay | Admitting: Obstetrics and Gynecology

## 2022-04-17 ENCOUNTER — Encounter: Payer: BC Managed Care – PPO | Admitting: Rheumatology

## 2022-05-09 ENCOUNTER — Ambulatory Visit: Payer: BC Managed Care – PPO | Admitting: Rheumatology

## 2022-05-12 ENCOUNTER — Encounter: Payer: BC Managed Care – PPO | Admitting: Rheumatology

## 2022-05-17 DIAGNOSIS — M069 Rheumatoid arthritis, unspecified: Secondary | ICD-10-CM | POA: Diagnosis not present

## 2022-05-17 DIAGNOSIS — M549 Dorsalgia, unspecified: Secondary | ICD-10-CM | POA: Diagnosis not present

## 2022-05-17 DIAGNOSIS — M353 Polymyalgia rheumatica: Secondary | ICD-10-CM | POA: Diagnosis not present

## 2022-05-17 DIAGNOSIS — M1990 Unspecified osteoarthritis, unspecified site: Secondary | ICD-10-CM | POA: Diagnosis not present

## 2022-05-17 DIAGNOSIS — M791 Myalgia, unspecified site: Secondary | ICD-10-CM | POA: Diagnosis not present

## 2022-05-17 LAB — COMPREHENSIVE METABOLIC PANEL
Albumin: 4.5 (ref 3.5–5.0)
Calcium: 10.1 (ref 8.7–10.7)
Globulin: 2.8
eGFR: 81

## 2022-05-17 LAB — CBC AND DIFFERENTIAL
HCT: 44 (ref 36–46)
Hemoglobin: 14.4 (ref 12.0–16.0)
Neutrophils Absolute: 7.9
Platelets: 288 10*3/uL (ref 150–400)
WBC: 9.8

## 2022-05-17 LAB — HEPATIC FUNCTION PANEL
ALT: 24 U/L (ref 7–35)
AST: 24 (ref 13–35)
Alkaline Phosphatase: 79 (ref 25–125)
Bilirubin, Total: 0.3

## 2022-05-17 LAB — BASIC METABOLIC PANEL
BUN: 14 (ref 4–21)
CO2: 25 — AB (ref 13–22)
Chloride: 100 (ref 99–108)
Glucose: 116
Potassium: 4.8 mEq/L (ref 3.5–5.1)
Sodium: 141 (ref 137–147)

## 2022-05-17 LAB — CBC: RBC: 4.72 (ref 3.87–5.11)

## 2022-05-20 LAB — LAB REPORT - SCANNED: EGFR: 81

## 2022-05-30 ENCOUNTER — Encounter: Payer: Self-pay | Admitting: Family Medicine

## 2022-06-02 ENCOUNTER — Ambulatory Visit: Payer: BC Managed Care – PPO | Admitting: Rheumatology

## 2022-06-16 DIAGNOSIS — M791 Myalgia, unspecified site: Secondary | ICD-10-CM | POA: Diagnosis not present

## 2022-06-16 DIAGNOSIS — M1990 Unspecified osteoarthritis, unspecified site: Secondary | ICD-10-CM | POA: Diagnosis not present

## 2022-06-16 DIAGNOSIS — M353 Polymyalgia rheumatica: Secondary | ICD-10-CM | POA: Diagnosis not present

## 2022-06-16 DIAGNOSIS — M069 Rheumatoid arthritis, unspecified: Secondary | ICD-10-CM | POA: Diagnosis not present

## 2022-10-04 NOTE — Progress Notes (Signed)
Phone [phone]   Subjective:   Patient is a 62 y.o. female presenting for annual physical.    No chief complaint on file.  HPI Annual - ***  *** - ***  *** - ***  *** - ***  *** - ***  *** - ***     See problem oriented charting- ROS- ROS: Gen: no fever, chills  Skin: no rash, itching ENT: no ear pain, ear drainage, nasal congestion, rhinorrhea, sinus pressure, sore throat Eyes: no blurry vision, double vision Resp: no cough, wheeze,SOB CV: no CP, palpitations, LE edema,  GI: no heartburn, n/v/d/c, abd pain GU: no dysuria, urgency, frequency, hematuria MSK: no joint pain, myalgias, back pain Neuro: no dizziness, headache, weakness, vertigo Psych: no depression, anxiety, insomnia, SI   The following were reviewed and entered/updated in epic: Past Medical History:  Diagnosis Date   Cataract    Elevated glucose    Exertional headache    had ed eval and had small  bleed ;felt to be able to follow  and recheck as needed per neuro    Fatigue    Gallstones    reported "cleansing " and passes many stones and no more sx currrently    GERD (gastroesophageal reflux disease)    Hx of right knee surgery    acl   Hx of varicella    Hyperlipidemia    ICB (intracranial bleed) (HCC)    h/o-mild   Seasonal allergies    Patient Active Problem List   Diagnosis Date Noted   Obesity 02/23/2022   Prediabetes 09/24/2021   Operculated retinal tear, left 08/23/2020   Seasonal allergies    Mixed hyperlipidemia 09/26/2010   Past Surgical History:  Procedure Laterality Date   ANTERIOR CRUCIATE LIGAMENT REPAIR  01/02/1993   right knee   EYE SURGERY Left 2015   torn retina    Family History  Problem Relation Age of Onset   Hyperlipidemia Mother    Alcohol abuse Father    Breast cancer Sister    Hyperlipidemia Maternal Grandmother    Heart disease Maternal Grandmother    Diabetes Maternal Grandfather    Heart disease Paternal Grandmother    Diabetes Other     Heart disease Other    Heart failure Other    Heart attack Other     Medications- reviewed and updated Current Outpatient Medications  Medication Sig Dispense Refill   DIGESTIVE ENZYMES PO Take 1 capsule by mouth daily.     Multiple Vitamins-Minerals (MULTIVITAMIN WOMEN 50+ PO) Take 1 tablet by mouth daily.     prednisoLONE 5 MG TABS tablet Take 3 tablets (15 mg total) by mouth daily. 270 tablet 0   Probiotic Product (PROBIOTIC PO) Take 1 tablet by mouth daily.     Triamcinolone Acetonide (NASACORT ALLERGY 24HR NA) Place into the nose. 1-2 times a week     No current facility-administered medications for this visit.    Allergies-reviewed and updated No Known Allergies  Social History   Social History Narrative   Lobbyist and services-self employed.    Objective  Objective:  There were no vitals taken for this visit. Physical Exam  Gen: WDWN NAD HEENT: NCAT, conjunctiva not injected, sclera nonicteric TM WNL B, OP moist, no exudates  NECK:  supple, no thyromegaly, no nodes, no carotid bruits CARDIAC: RRR, S1S2+, no murmur. DP 2+B LUNGS: CTAB. No wheezes ABDOMEN:  BS+, soft, NTND, No HSM, no masses EXT:  no edema MSK: no gross abnormalities. MS 5/5 all  4 NEURO: A&O x3.  CN II-XII intact.  PSYCH: normal mood. Good eye contact     Assessment and Plan   Health Maintenance counseling: 1. Anticipatory guidance: Patient counseled regarding regular dental exams q6 months, eye exams,  avoiding smoking and second hand smoke, limiting alcohol to 1 beverage per day, no illicit drugs.   2. Risk factor reduction:  Advised patient of need for regular exercise and diet rich and fruits and vegetables to reduce risk of heart attack and stroke. Exercise- ***.  Wt Readings from Last 3 Encounters:  02/23/22 198 lb (89.8 kg)  01/04/22 196 lb (88.9 kg)  10/31/21 181 lb 4 oz (82.2 kg)   3. Immunizations/screenings/ancillary studies Immunization History  Administered Date(s)  Administered   Janssen (J&J) SARS-COV-2 Vaccination 04/11/2019, 11/06/2019   Pfizer Covid-19 Vaccine Bivalent Booster 48yrs & up 10/09/2020   Td 01/05/2003   Tdap 05/27/2014   Health Maintenance Due  Topic Date Due   HIV Screening  Never done   Colonoscopy  Never done   Zoster Vaccines- Shingrix (1 of 2) Never done   INFLUENZA VACCINE  Never done   COVID-19 Vaccine (4 - 2023-24 season) 09/03/2022    4. Cervical cancer screening- *** 5. Breast cancer screening-  mammogram UTD, last done 03/23/22 6. Colon cancer screening - *** 7. Skin cancer screening- advised regular sunscreen use. Denies worrisome, changing, or new skin lesions.  8. Birth control/STD check- *** 9. Osteoporosis screening- *** 10. Smoking associated screening - *** smoker  There are no diagnoses linked to this encounter.  Recommended follow up: No follow-ups on file.  Lab/Order associations: ***fasting   I,Rachel Rivera,acting as a scribe for Angelena Sole, MD.,have documented all relevant documentation on the behalf of Angelena Sole, MD,as directed by  Angelena Sole, MD while in the presence of Angelena Sole, MD.  I, Isabelle Course, have reviewed all documentation for this visit. The documentation on 10/04/22 for the exam, diagnosis, procedures, and orders are all accurate and complete.  ***   Isabelle Course

## 2022-10-05 ENCOUNTER — Encounter: Payer: Self-pay | Admitting: Family Medicine

## 2022-10-05 ENCOUNTER — Ambulatory Visit (INDEPENDENT_AMBULATORY_CARE_PROVIDER_SITE_OTHER): Payer: BC Managed Care – PPO | Admitting: Family Medicine

## 2022-10-05 VITALS — BP 110/68 | HR 71 | Temp 98.6°F | Resp 18 | Ht 60.75 in | Wt 194.1 lb

## 2022-10-05 DIAGNOSIS — Z0000 Encounter for general adult medical examination without abnormal findings: Secondary | ICD-10-CM

## 2022-10-05 DIAGNOSIS — M353 Polymyalgia rheumatica: Secondary | ICD-10-CM | POA: Diagnosis not present

## 2022-10-05 LAB — LIPID PANEL
Cholesterol: 261 mg/dL — ABNORMAL HIGH (ref 0–200)
HDL: 63.6 mg/dL (ref >39.00)
LDL Cholesterol: 173 mg/dL — ABNORMAL HIGH (ref 0–99)
NonHDL: 197.61
Total CHOL/HDL Ratio: 4
Triglycerides: 122 mg/dL (ref 0.0–149.0)
VLDL: 24.4 mg/dL (ref 0.0–40.0)

## 2022-10-05 LAB — CBC WITH DIFFERENTIAL/PLATELET
Basophils Absolute: 0 10*3/uL (ref 0.0–0.1)
Basophils Relative: 0.4 % (ref 0.0–3.0)
Eosinophils Absolute: 0.2 10*3/uL (ref 0.0–0.7)
Eosinophils Relative: 2.1 % (ref 0.0–5.0)
HCT: 42.5 % (ref 36.0–46.0)
Hemoglobin: 13.7 g/dL (ref 12.0–15.0)
Lymphocytes Relative: 18.4 % (ref 12.0–46.0)
Lymphs Abs: 1.5 10*3/uL (ref 0.7–4.0)
MCHC: 32.3 g/dL (ref 30.0–36.0)
MCV: 93.9 fL (ref 78.0–100.0)
Monocytes Absolute: 0.6 10*3/uL (ref 0.1–1.0)
Monocytes Relative: 7.6 % (ref 3.0–12.0)
Neutro Abs: 5.7 10*3/uL (ref 1.4–7.7)
Neutrophils Relative %: 71.5 % (ref 43.0–77.0)
Platelets: 277 10*3/uL (ref 150.0–400.0)
RBC: 4.52 Mil/uL (ref 3.87–5.11)
RDW: 12.4 % (ref 11.5–15.5)
WBC: 8 10*3/uL (ref 4.0–10.5)

## 2022-10-05 LAB — COMPREHENSIVE METABOLIC PANEL
ALT: 13 U/L (ref 0–35)
AST: 18 U/L (ref 0–37)
Albumin: 4.1 g/dL (ref 3.5–5.2)
Alkaline Phosphatase: 50 U/L (ref 39–117)
BUN: 18 mg/dL (ref 6–23)
CO2: 27 meq/L (ref 19–32)
Calcium: 9.3 mg/dL (ref 8.4–10.5)
Chloride: 104 meq/L (ref 96–112)
Creatinine, Ser: 0.87 mg/dL (ref 0.40–1.20)
GFR: 71.87 mL/min (ref >60.00)
Glucose, Bld: 108 mg/dL — ABNORMAL HIGH (ref 70–99)
Potassium: 4.2 meq/L (ref 3.5–5.1)
Sodium: 139 meq/L (ref 135–145)
Total Bilirubin: 0.5 mg/dL (ref 0.2–1.2)
Total Protein: 7.1 g/dL (ref 6.0–8.3)

## 2022-10-05 LAB — TSH: TSH: 2.04 u[IU]/mL (ref 0.35–5.50)

## 2022-10-05 LAB — HEMOGLOBIN A1C: Hgb A1c MFr Bld: 6 % (ref 4.6–6.5)

## 2022-10-05 NOTE — Patient Instructions (Signed)

## 2022-10-08 NOTE — Progress Notes (Signed)
Labs look great except 1.  Your cholesterol levels are elevated.  Work on low cholesterol and lower carbs/sugars diet and  get exercise to try to lower your cholesterol.  2.  A1C(3 month average of sugars) is elevated.  This is considered PreDiabetes.  Work on diet-decrease sugars and starches and aim for 30 minutes of exercise 5 days/week to prevent progression to diabetes  3.  Sch appt in 6 months to see me and we will recheck.

## 2022-10-11 DIAGNOSIS — Z7952 Long term (current) use of systemic steroids: Secondary | ICD-10-CM | POA: Diagnosis not present

## 2022-10-11 DIAGNOSIS — M353 Polymyalgia rheumatica: Secondary | ICD-10-CM | POA: Diagnosis not present

## 2022-10-11 DIAGNOSIS — M1990 Unspecified osteoarthritis, unspecified site: Secondary | ICD-10-CM | POA: Diagnosis not present

## 2022-10-11 DIAGNOSIS — M069 Rheumatoid arthritis, unspecified: Secondary | ICD-10-CM | POA: Diagnosis not present

## 2023-03-06 IMAGING — DX DG CHEST 2V
2 series · 2 of 2 positions shown · non-contrast
Comparison: No priors.

CLINICAL DATA: 61-year-old female with history of shortness of
breath and chest pain.

EXAM:
CHEST - 2 VIEW

[chest pa]
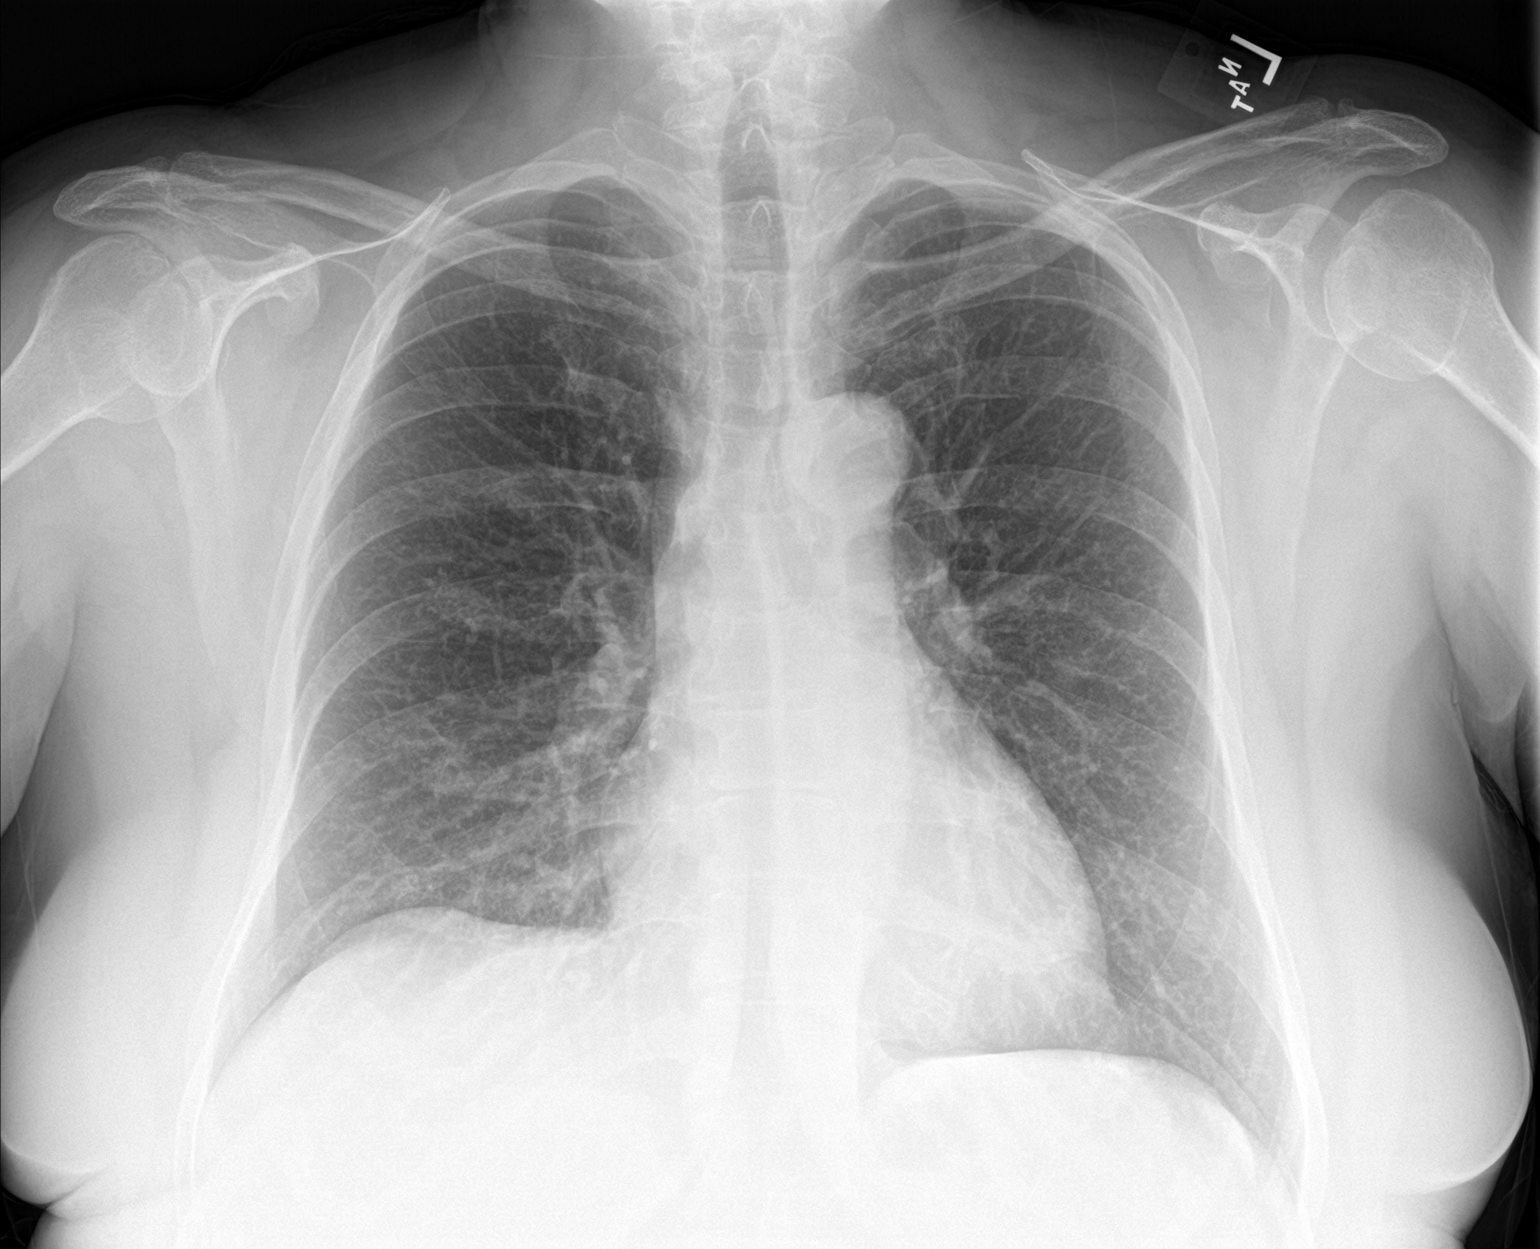

[chest lat]
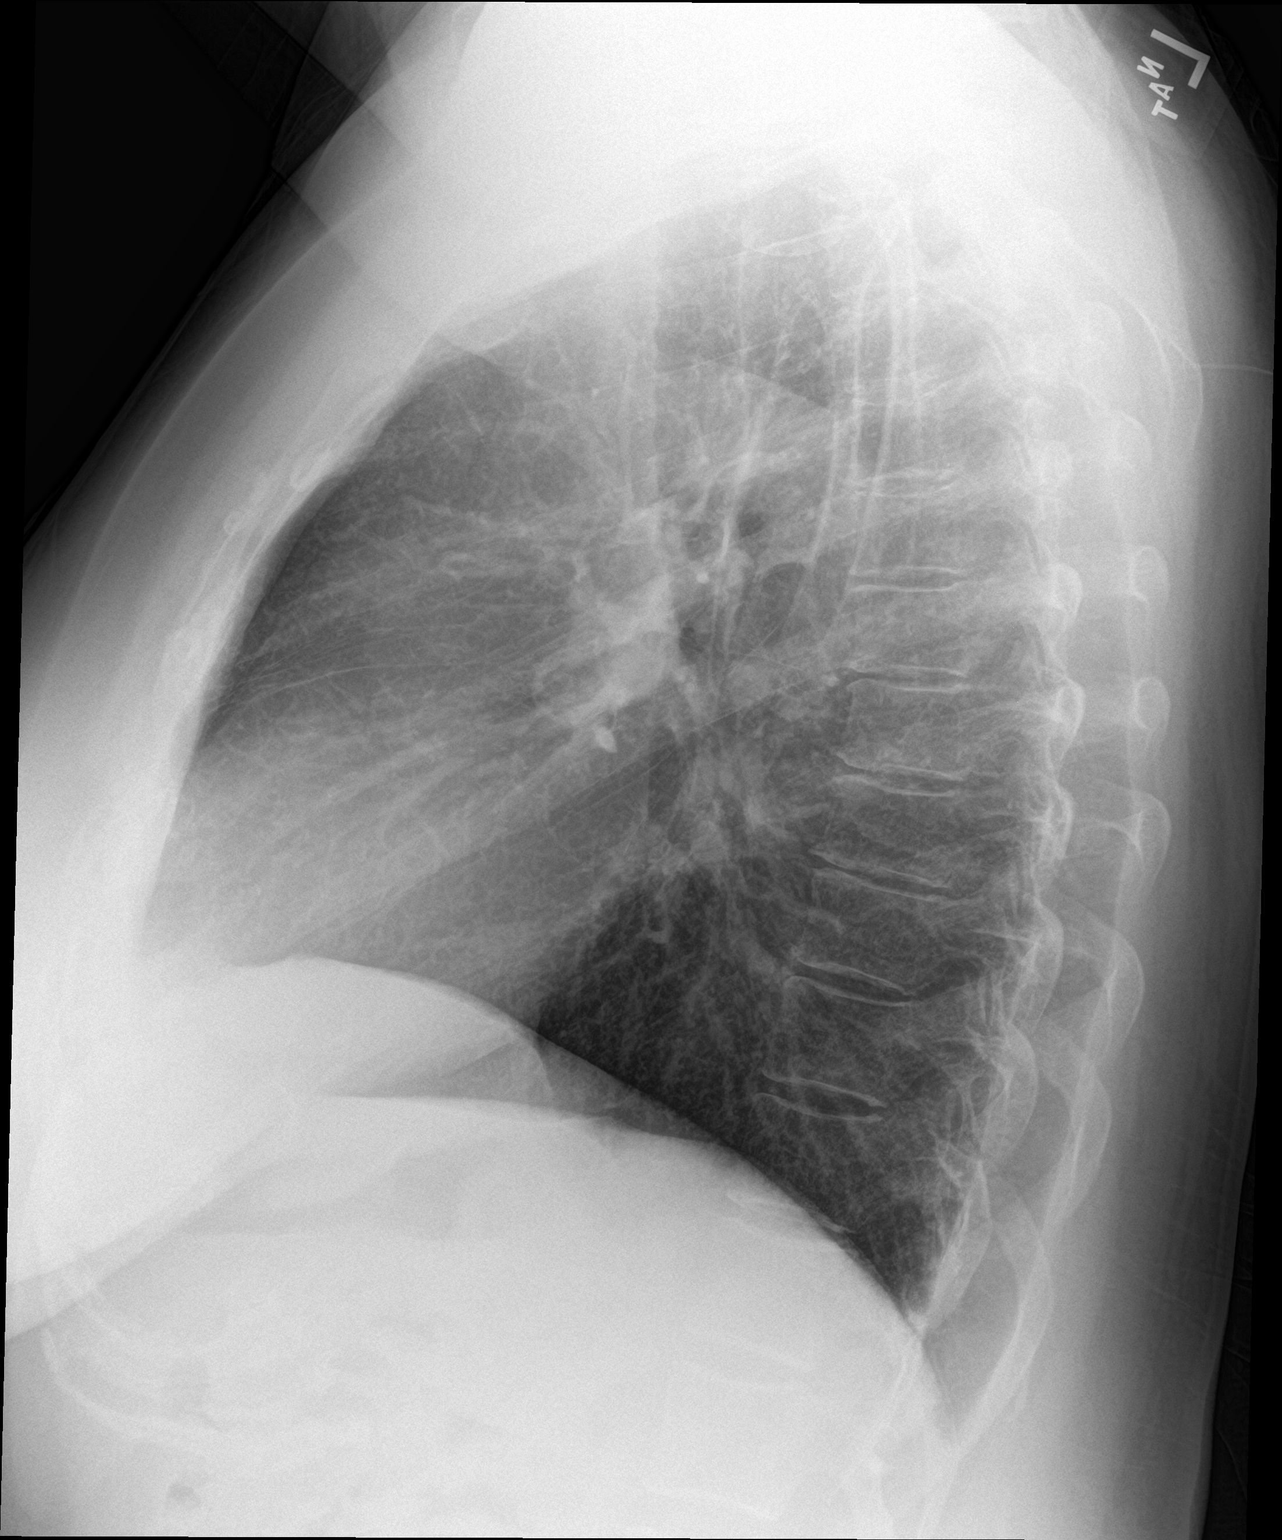

[2 of 2 positions shown; findings below may reference images not displayed]

FINDINGS: Lung volumes are normal. No consolidative airspace disease. No
pleural effusions. No pneumothorax. No pulmonary nodule or mass
noted. Pulmonary vasculature and the cardiomediastinal silhouette
are within normal limits.
IMPRESSION: No radiographic evidence of acute cardiopulmonary disease.

## 2023-10-05 ENCOUNTER — Encounter: Payer: Self-pay | Admitting: Family Medicine

## 2023-10-05 ENCOUNTER — Ambulatory Visit: Payer: BC Managed Care – PPO | Admitting: Family Medicine

## 2023-10-05 ENCOUNTER — Ambulatory Visit: Payer: Self-pay | Admitting: Family Medicine

## 2023-10-05 VITALS — BP 130/88 | HR 98 | Temp 97.9°F | Ht 60.75 in | Wt 177.0 lb

## 2023-10-05 DIAGNOSIS — M353 Polymyalgia rheumatica: Secondary | ICD-10-CM

## 2023-10-05 DIAGNOSIS — R7303 Prediabetes: Secondary | ICD-10-CM | POA: Diagnosis not present

## 2023-10-05 DIAGNOSIS — Z0000 Encounter for general adult medical examination without abnormal findings: Secondary | ICD-10-CM

## 2023-10-05 LAB — COMPREHENSIVE METABOLIC PANEL WITH GFR
ALT: 21 U/L (ref 0–35)
AST: 26 U/L (ref 0–37)
Albumin: 4.6 g/dL (ref 3.5–5.2)
Alkaline Phosphatase: 80 U/L (ref 39–117)
BUN: 9 mg/dL (ref 6–23)
CO2: 27 meq/L (ref 19–32)
Calcium: 9.8 mg/dL (ref 8.4–10.5)
Chloride: 101 meq/L (ref 96–112)
Creatinine, Ser: 0.7 mg/dL (ref 0.40–1.20)
GFR: 92.64 mL/min (ref >60.00)
Glucose, Bld: 96 mg/dL (ref 70–99)
Potassium: 4.6 meq/L (ref 3.5–5.1)
Sodium: 138 meq/L (ref 135–145)
Total Bilirubin: 0.7 mg/dL (ref 0.2–1.2)
Total Protein: 7.1 g/dL (ref 6.0–8.3)

## 2023-10-05 LAB — SEDIMENTATION RATE: Sed Rate: 20 mm/h (ref 0–30)

## 2023-10-05 LAB — CBC WITH DIFFERENTIAL/PLATELET
Basophils Absolute: 0 K/uL (ref 0.0–0.1)
Basophils Relative: 0.3 % (ref 0.0–3.0)
Eosinophils Absolute: 0.1 K/uL (ref 0.0–0.7)
Eosinophils Relative: 1.5 % (ref 0.0–5.0)
HCT: 42.8 % (ref 36.0–46.0)
Hemoglobin: 14.2 g/dL (ref 12.0–15.0)
Lymphocytes Relative: 19 % (ref 12.0–46.0)
Lymphs Abs: 1.3 K/uL (ref 0.7–4.0)
MCHC: 33.2 g/dL (ref 30.0–36.0)
MCV: 90.7 fl (ref 78.0–100.0)
Monocytes Absolute: 0.4 K/uL (ref 0.1–1.0)
Monocytes Relative: 6.6 % (ref 3.0–12.0)
Neutro Abs: 4.9 K/uL (ref 1.4–7.7)
Neutrophils Relative %: 72.6 % (ref 43.0–77.0)
Platelets: 210 K/uL (ref 150.0–400.0)
RBC: 4.71 Mil/uL (ref 3.87–5.11)
RDW: 12.9 % (ref 11.5–15.5)
WBC: 6.7 K/uL (ref 4.0–10.5)

## 2023-10-05 LAB — LIPID PANEL
Cholesterol: 263 mg/dL — ABNORMAL HIGH (ref 0–200)
HDL: 54.3 mg/dL (ref >39.00)
LDL Cholesterol: 181 mg/dL — ABNORMAL HIGH (ref 0–99)
NonHDL: 208.51
Total CHOL/HDL Ratio: 5
Triglycerides: 137 mg/dL (ref 0.0–149.0)
VLDL: 27.4 mg/dL (ref 0.0–40.0)

## 2023-10-05 LAB — TSH: TSH: 1.95 u[IU]/mL (ref 0.35–5.50)

## 2023-10-05 LAB — C-REACTIVE PROTEIN: CRP: <0.5 mg/dL (ref 0.5–20.0)

## 2023-10-05 LAB — HEMOGLOBIN A1C: Hgb A1c MFr Bld: 5.9 % (ref 4.6–6.5)

## 2023-10-05 NOTE — Patient Instructions (Signed)
It was very nice to see you today! Stay active   PLEASE NOTE:  If you had any lab tests please let us know if you have not heard back within a few days. You may see your results on MyChart before we have a chance to review them but we will give you a call once they are reviewed by us. If we ordered any referrals today, please let us know if you have not heard from their office within the next week.   Please try these tips to maintain a healthy lifestyle:  Eat most of your calories during the day when you are active. Eliminate processed foods including packaged sweets (pies, cakes, cookies), reduce intake of potatoes, white bread, white pasta, and white rice. Look for whole grain options, oat flour or almond flour.  Each meal should contain half fruits/vegetables, one quarter protein, and one quarter carbs (no bigger than a computer mouse).  Cut down on sweet beverages. This includes juice, soda, and sweet tea. Also watch fruit intake, though this is a healthier sweet option, it still contains natural sugar! Limit to 3 servings daily.  Drink at least 1 glass of water with each meal and aim for at least 8 glasses per day  Exercise at least 150 minutes every week.   

## 2023-10-05 NOTE — Progress Notes (Signed)
 Labs ok except  A1C(3 month average of sugars) is elevated.  This is considered PreDiabetes.  Work on diet-decrease sugars and starches and aim for 30 minutes of exercise 5 days/week to prevent progression to diabetes  Your cholesterol levels are elevated.  Work on low cholesterol and lower carbs/sugars diet and  get exercise to try to lower your cholesterol.  Needs to consider meds  Recommend seeing me in 6 months to check status

## 2023-10-05 NOTE — Progress Notes (Signed)
 Phone [phone]   Subjective:   Patient is a 63 y.o. female presenting for annual physical.    Chief Complaint  Patient presents with   Annual Exam    Physical; want to get blood work done. No longer have rheumatologist    Annual-exercising Discussed the use of AI scribe software for clinical note transcription with the patient, who gave verbal consent to proceed.  History of Present Illness Jeanne Porter is a 64 year old female who presents for an annual physical exam.  She has successfully weaned off prednisone  over a seven-month period, completing this in June. She experienced significant side effects from prednisone , including mood changes and facial swelling, which have resolved since discontinuation. She notes improvement in her strength and no longer experiences weakness or achy pain, except for some stiffness in the mornings that resolves within 30 minutes. She feels stiff in the mornings but improves within 30 minutes.  She has not had her blood checked in a year and wants to have it done to monitor her condition. She is currently without a rheumatologist due to insurance changes and the retirement of her previous doctor.  She has lost 20 pounds since stopping prednisone  and attributes this to eating clean and exercising. Her diet includes organic foods and specific food combinations, with dietary flexibility on weekends. She has recently started exercising regularly using a new elliptical machine, burning approximately 900 calories a week.  She experienced diarrhea for a period, which she attributed to ox bile salts she was taking for gallbladder support. After discontinuing the supplement, her symptoms resolved.  She had COVID-19 last Christmas, which she contracted while in the emergency room with her partner. She has not received any COVID-19 vaccinations due to concerns about her autoimmune condition.  She has not had a mammogram in two years and plans to have one every  three to four years. She is cautious about taking any new vaccines, including the shingles vaccine, due to concerns about her autoimmune condition.  No headache, dizziness, syncope, double vision, runny nose, congestion, sore throat, changing moles, rashes, chest pain, shortness of breath, palpitations, vomiting, diarrhea (resolved), constipation.    See problem oriented charting- ROS- ROS: Gen: no fever, chills  Skin: no rash, itching ENT: no ear pain, ear drainage, nasal congestion, rhinorrhea, sinus pressure, sore throat Eyes: no blurry vision, double vision Resp: no cough, wheeze,SOB CV: no CP, palpitations, LE edema,  GI: no heartburn, n/v/d/c, abd pain GU: no dysuria, urgency, frequency, hematuria MSK: no joint pain, myalgias, back pain Neuro: no dizziness, headache, weakness, vertigo Psych: no depression, anxiety, insomnia, SI   The following were reviewed and entered/updated in epic: Past Medical History:  Diagnosis Date   Cataract    Elevated glucose    Exertional headache    had ed eval and had small  bleed ;felt to be able to follow  and recheck as needed per neuro    Fatigue    Gallstones    reported cleansing  and passes many stones and no more sx currrently    GERD (gastroesophageal reflux disease)    Hx of right knee surgery    acl   Hx of varicella    Hyperlipidemia    ICB (intracranial bleed) (HCC)    h/o-mild   PMR (polymyalgia rheumatica)    Seasonal allergies    Patient Active Problem List   Diagnosis Date Noted   Obesity 02/23/2022   Prediabetes 09/24/2021   Operculated retinal tear, left 08/23/2020  Seasonal allergies    Mixed hyperlipidemia 09/26/2010   Past Surgical History:  Procedure Laterality Date   ANTERIOR CRUCIATE LIGAMENT REPAIR  01/02/1993   right knee   EYE SURGERY Left 2015   torn retina    Family History  Problem Relation Age of Onset   Hyperlipidemia Mother    Alcohol abuse Father    Breast cancer Sister     Hyperlipidemia Maternal Grandmother    Heart disease Maternal Grandmother    Diabetes Maternal Grandfather    Heart disease Paternal Grandmother    Diabetes Other    Heart disease Other    Heart failure Other    Heart attack Other     Medications- reviewed and updated Current Outpatient Medications  Medication Sig Dispense Refill   DIGESTIVE ENZYMES PO Take 1 capsule by mouth daily.     Multiple Vitamins-Minerals (MULTIVITAMIN WOMEN 50+ PO) Take 1 tablet by mouth daily.     Probiotic Product (PROBIOTIC PO) Take 1 tablet by mouth daily.     Triamcinolone Acetonide (NASACORT ALLERGY 24HR NA) Place into the nose. 1-2 times a week     No current facility-administered medications for this visit.    Allergies-reviewed and updated No Known Allergies  Social History   Social History Narrative   Lobbyist and services-self employed.    Objective  Objective:  BP 130/88   Pulse 98   Temp 97.9 F (36.6 C)   Ht 5' 0.75 (1.543 m)   Wt 177 lb (80.3 kg)   SpO2 99%   BMI 33.72 kg/m  Physical Exam  Gen: WDWN NAD HEENT: NCAT, conjunctiva not injected, sclera nonicteric TM WNL B, OP moist, no exudates  NECK:  supple, no thyromegaly, no nodes, no carotid bruits CARDIAC: RRR, S1S2+, no murmur. DP 2+B LUNGS: CTAB. No wheezes ABDOMEN:  BS+, soft, NTND, No HSM, no masses EXT:  no edema MSK: no gross abnormalities. MS 5/5 all 4 NEURO: A&O x3.  CN II-XII intact.  PSYCH: normal mood. Good eye contact     Assessment and Plan   Health Maintenance counseling: 1. Anticipatory guidance: Patient counseled regarding regular dental exams q6 months, eye exams,  avoiding smoking and second hand smoke, limiting alcohol to 1 beverage per day, no illicit drugs.   2. Risk factor reduction:  Advised patient of need for regular exercise and diet rich and fruits and vegetables to reduce risk of heart attack and stroke. Exercise- +.  Wt Readings from Last 3 Encounters:  10/05/23 177 lb  (80.3 kg)  10/05/22 194 lb 2 oz (88.1 kg)  02/23/22 198 lb (89.8 kg)   3. Immunizations/screenings/ancillary studies Immunization History  Administered Date(s) Administered   Hep A / Hep B 12/10/2003, 08/02/2004   Hep A, Unspecified 09/15/2003   Hep B, Unspecified 09/28/2003   Influenza, Quadrivalent, Recombinant, Inj, Pf 12/22/2020   Janssen (J&J) SARS-COV-2 Vaccination 04/11/2019, 11/06/2019   Meningococcal Conjugate 08/02/2004   Pfizer Covid-19 Vaccine Bivalent Booster 34yrs & up 10/09/2020   Pfizer(Comirnaty)Fall Seasonal Vaccine 12 years and older 10/28/2021   Td 01/05/2003, 09/15/2003   Tdap 05/27/2014   There are no preventive care reminders to display for this patient.   4. Cervical cancer screening- declined 5. Breast cancer screening-  mammogram declined 6. Colon cancer screening - due 05/26/24 7. Skin cancer screening- advised regular sunscreen use. Denies worrisome, changing, or new skin lesions.  8. Birth control/STD check- n/a 9. Osteoporosis screening- utd 10. Smoking associated screening - non smoker  Wellness examination -  Lipid panel -     Comprehensive metabolic panel with GFR -     CBC with Differential/Platelet -     Hemoglobin A1c -     TSH -     Sedimentation rate -     C-reactive protein  Prediabetes  PMR (polymyalgia rheumatica) -     Sedimentation rate -     C-reactive protein   Wellness-anticipatory guidance.  Work on Diet/Exercise  Check CBC,CMP,lipids,TSH, A1C.  F/u 1 yr  Assessment and Plan Assessment & Plan Adult Wellness Visit   She reports feeling great with regular exercise and no new surgeries or significant health issues. A physical examination will be performed, and blood work including cholesterol, sed rate, and CRP will be scheduled. Next year's wellness visit will also be scheduled.  Polymyalgia Rheumatica  - resolved She successfully weaned off prednisone  over seven months, with the last dose in June. Her symptoms have  significantly improved, with no weakness or achy pain except for typical arthritis symptoms. Regular exercise and clean eating have contributed to her improvement. Sed rate and CRP will be ordered to assess inflammation, and she will be referred to a rheumatologist within the [facility] system for occasional follow-up.  Hypercholesterolemia   Her previous cholesterol levels were around 260 mg/dL, reduced to 779 mg/dL with clean eating and exercise. A cholesterol panel will be ordered as part of the blood work due to her genetic predisposition.  General Health Maintenance   She prefers to space out regular screenings, declining annual mammograms and gynecological visits in favor of every 3-4 years. She declines COVID and shingles vaccinations due to concerns about autoimmune reactions, acknowledging the risks but preferring to avoid additional chemicals.    Recommended follow up: Return in about 1 year (around 10/04/2024) for annual physical.  Lab/Order associations:apple 2 hrs ago fasting  Jenkins CHRISTELLA Carrel, MD

## 2024-02-07 NOTE — Progress Notes (Unsigned)
 "  Office Visit Note  Patient: Jeanne Porter             Date of Birth: Feb 22, 1960           MRN: [id number]             PCP: Wendolyn Jenkins Jansky, MD Referring: Wendolyn Jenkins Jansky, MD Visit Date: 02/21/2024 Occupation: Data Unavailable  Subjective:  No chief complaint on file.   History of Present Illness: Jeanne Porter is a 64 y.o. female ***     Activities of Daily Living:  Patient reports morning stiffness for *** {minute/hour:19697}.   Patient {ACTIONS;DENIES/REPORTS:21021675::Denies} nocturnal pain.  Difficulty dressing/grooming: {ACTIONS;DENIES/REPORTS:21021675::Denies} Difficulty climbing stairs: {ACTIONS;DENIES/REPORTS:21021675::Denies} Difficulty getting out of chair: {ACTIONS;DENIES/REPORTS:21021675::Denies} Difficulty using hands for taps, buttons, cutlery, and/or writing: {ACTIONS;DENIES/REPORTS:21021675::Denies}  No Rheumatology ROS completed.   PMFS History:  Patient Active Problem List   Diagnosis Date Noted   Obesity 02/23/2022   Prediabetes 09/24/2021   Operculated retinal tear, left 08/23/2020   Seasonal allergies    Mixed hyperlipidemia 09/26/2010    Past Medical History:  Diagnosis Date   Cataract    Elevated glucose    Exertional headache    had ed eval and had small  bleed ;felt to be able to follow  and recheck as needed per neuro    Fatigue    Gallstones    reported cleansing  and passes many stones and no more sx currrently    GERD (gastroesophageal reflux disease)    Hx of right knee surgery    acl   Hx of varicella    Hyperlipidemia    ICB (intracranial bleed) (HCC)    h/o-mild   PMR (polymyalgia rheumatica)    Seasonal allergies     Family History  Problem Relation Age of Onset   Hyperlipidemia Mother    Alcohol abuse Father    Breast cancer Sister    Hyperlipidemia Maternal Grandmother    Heart disease Maternal Grandmother    Diabetes Maternal Grandfather    Heart disease Paternal Grandmother    Diabetes Other     Heart disease Other    Heart failure Other    Heart attack Other    Past Surgical History:  Procedure Laterality Date   ANTERIOR CRUCIATE LIGAMENT REPAIR  01/02/1993   right knee   EYE SURGERY Left 2015   torn retina   Social History[1] Social History   Social History Narrative   Lobbyist and services-self employed.      Immunization History  Administered Date(s) Administered   Hep A / Hep B 12/10/2003, 08/02/2004   Hep A, Unspecified 09/15/2003   Hep B, Unspecified 09/28/2003   Influenza, Quadrivalent, Recombinant, Inj, Pf 12/22/2020   Janssen (J&J) SARS-COV-2 Vaccination 04/11/2019, 11/06/2019   Meningococcal Conjugate 08/02/2004   Pfizer Covid-19 Vaccine Bivalent Booster 64yrs & up 10/09/2020   Pfizer(Comirnaty)Fall Seasonal Vaccine 12 years and older 10/28/2021   Td 01/05/2003, 09/15/2003   Tdap 05/27/2014     Objective: Vital Signs: There were no vitals taken for this visit.   Physical Exam   Musculoskeletal Exam: ***  CDAI Exam: CDAI Score: -- Patient Global: --; Provider Global: -- Swollen: --; Tender: -- Joint Exam 02/21/2024   No joint exam has been documented for this visit   There is currently no information documented on the homunculus. Go to the Rheumatology activity and complete the homunculus joint exam.  Investigation: No additional findings.  Imaging: No results found.  Recent Labs: Lab Results  Component Value Date   WBC 6.7 10/05/2023   HGB 14.2 10/05/2023   PLT 210.0 10/05/2023   NA 138 10/05/2023   K 4.6 10/05/2023   CL 101 10/05/2023   CO2 27 10/05/2023   GLUCOSE 96 10/05/2023   BUN 9 10/05/2023   CREATININE 0.70 10/05/2023   BILITOT 0.7 10/05/2023   ALKPHOS 80 10/05/2023   AST 26 10/05/2023   ALT 21 10/05/2023   PROT 7.1 10/05/2023   ALBUMIN 4.6 10/05/2023   CALCIUM 9.8 10/05/2023   October 05, 2023 LDL 181, hemoglobin A1c 5.9, TSH normal, sed rate 20, CRP<0.5  Speciality Comments: No specialty comments  available.  Procedures:  No procedures performed Allergies: Patient has no known allergies.   Assessment / Plan:     Visit Diagnoses: Polymyalgia rheumatica  Prediabetes  Mixed hyperlipidemia  Seasonal allergies  Orders: No orders of the defined types were placed in this encounter.  No orders of the defined types were placed in this encounter.   Face-to-face time spent with patient was *** minutes. Greater than 50% of time was spent in counseling and coordination of care.  Follow-Up Instructions: No follow-ups on file.   Maya Nash, MD  Note - This record has been created using Animal nutritionist.  Chart creation errors have been sought, but may not always  have been located. Such creation errors do not reflect on  the standard of medical care.    [1]  Social History Tobacco Use   Smoking status: Former    Types: Cigarettes    Passive exposure: Past   Smokeless tobacco: Never   Tobacco comments:    quit 1994  Vaping Use   Vaping status: Never Used  Substance Use Topics   Alcohol use: Yes    Alcohol/week: 6.0 - 8.0 standard drinks of alcohol    Types: 3 - 4 Cans of beer, 3 - 4 Shots of liquor per week   Drug use: Not Currently    Types: Marijuana    Comment: used in the past   "

## 2024-02-21 ENCOUNTER — Encounter: Admitting: Rheumatology

## 2024-02-21 DIAGNOSIS — J302 Other seasonal allergic rhinitis: Secondary | ICD-10-CM

## 2024-02-21 DIAGNOSIS — M353 Polymyalgia rheumatica: Secondary | ICD-10-CM

## 2024-02-21 DIAGNOSIS — R7303 Prediabetes: Secondary | ICD-10-CM

## 2024-02-21 DIAGNOSIS — E782 Mixed hyperlipidemia: Secondary | ICD-10-CM

## 2024-10-07 ENCOUNTER — Encounter: Payer: Self-pay | Admitting: Family Medicine
